# Patient Record
Sex: Female | Born: 1955 | Race: Black or African American | Hispanic: No | Smoking: Never smoker
Health system: Southern US, Community
[De-identification: ages and names within clinical notes are randomized; demographics above are authoritative.]

## PROBLEM LIST (undated history)

## (undated) DIAGNOSIS — I10 Essential (primary) hypertension: Secondary | ICD-10-CM

## (undated) DIAGNOSIS — C50912 Malignant neoplasm of unspecified site of left female breast: Secondary | ICD-10-CM

## (undated) DIAGNOSIS — E119 Type 2 diabetes mellitus without complications: Secondary | ICD-10-CM

## (undated) DIAGNOSIS — E785 Hyperlipidemia, unspecified: Secondary | ICD-10-CM

## (undated) HISTORY — DX: Type 2 diabetes mellitus without complications: E11.9

## (undated) HISTORY — DX: Malignant neoplasm of unspecified site of left female breast: C50.912

## (undated) HISTORY — DX: Essential (primary) hypertension: I10

## (undated) HISTORY — PX: TUBAL LIGATION: SHX77

## (undated) HISTORY — PX: PORT-A-CATH REMOVAL: SHX5289

## (undated) HISTORY — DX: Hyperlipidemia, unspecified: E78.5

---

## 1996-03-15 HISTORY — PX: BREAST BIOPSY: SHX20

## 2013-03-15 DIAGNOSIS — I10 Essential (primary) hypertension: Secondary | ICD-10-CM

## 2013-03-15 HISTORY — PX: BREAST LUMPECTOMY: SHX2

## 2013-03-15 HISTORY — DX: Essential (primary) hypertension: I10

## 2013-10-04 DIAGNOSIS — D701 Agranulocytosis secondary to cancer chemotherapy: Secondary | ICD-10-CM | POA: Insufficient documentation

## 2013-10-04 DIAGNOSIS — T451X5A Adverse effect of antineoplastic and immunosuppressive drugs, initial encounter: Secondary | ICD-10-CM | POA: Insufficient documentation

## 2013-11-09 DIAGNOSIS — E119 Type 2 diabetes mellitus without complications: Secondary | ICD-10-CM | POA: Insufficient documentation

## 2014-03-22 ENCOUNTER — Telehealth: Payer: Self-pay | Admitting: *Deleted

## 2014-03-22 NOTE — Telephone Encounter (Signed)
Ava in the [place] office called stating that Dr. Pablo Ledger is there with this pt and wants pt to have genetics.  Confirmed 04/03/14 genetic appt w/ Ava.  She is to give appt info to pt with my number so the pt can call with any questions or concerns.

## 2014-04-03 ENCOUNTER — Encounter: Payer: Self-pay | Admitting: Genetic Counselor

## 2014-04-03 ENCOUNTER — Other Ambulatory Visit: Payer: Self-pay

## 2014-04-04 DIAGNOSIS — Z95828 Presence of other vascular implants and grafts: Secondary | ICD-10-CM | POA: Insufficient documentation

## 2015-06-17 ENCOUNTER — Encounter (INDEPENDENT_AMBULATORY_CARE_PROVIDER_SITE_OTHER): Payer: Self-pay | Admitting: *Deleted

## 2016-01-05 ENCOUNTER — Encounter (HOSPITAL_COMMUNITY): Payer: BLUE CROSS/BLUE SHIELD

## 2016-01-05 ENCOUNTER — Encounter (HOSPITAL_COMMUNITY): Payer: Self-pay | Admitting: Oncology

## 2016-01-05 ENCOUNTER — Encounter (HOSPITAL_COMMUNITY): Payer: BLUE CROSS/BLUE SHIELD | Attending: Oncology | Admitting: Oncology

## 2016-01-05 VITALS — BP 149/99 | HR 90 | Temp 97.8°F | Resp 16 | Ht 63.0 in | Wt 162.0 lb

## 2016-01-05 DIAGNOSIS — Z23 Encounter for immunization: Secondary | ICD-10-CM

## 2016-01-05 DIAGNOSIS — Z170 Estrogen receptor positive status [ER+]: Principal | ICD-10-CM

## 2016-01-05 DIAGNOSIS — Z780 Asymptomatic menopausal state: Secondary | ICD-10-CM

## 2016-01-05 DIAGNOSIS — C50912 Malignant neoplasm of unspecified site of left female breast: Secondary | ICD-10-CM

## 2016-01-05 DIAGNOSIS — Z79811 Long term (current) use of aromatase inhibitors: Secondary | ICD-10-CM

## 2016-01-05 DIAGNOSIS — C50212 Malignant neoplasm of upper-inner quadrant of left female breast: Secondary | ICD-10-CM | POA: Diagnosis present

## 2016-01-05 HISTORY — DX: Malignant neoplasm of unspecified site of left female breast: C50.912

## 2016-01-05 LAB — CBC WITH DIFFERENTIAL/PLATELET
Basophils Absolute: 0 10*3/uL (ref 0.0–0.1)
Basophils Relative: 0 %
EOS ABS: 0.1 10*3/uL (ref 0.0–0.7)
EOS PCT: 2 %
HCT: 37.9 % (ref 36.0–46.0)
Hemoglobin: 12.9 g/dL (ref 12.0–15.0)
LYMPHS ABS: 2.9 10*3/uL (ref 0.7–4.0)
Lymphocytes Relative: 43 %
MCH: 29.3 pg (ref 26.0–34.0)
MCHC: 34 g/dL (ref 30.0–36.0)
MCV: 86.1 fL (ref 78.0–100.0)
Monocytes Absolute: 0.5 10*3/uL (ref 0.1–1.0)
Monocytes Relative: 7 %
Neutro Abs: 3.3 10*3/uL (ref 1.7–7.7)
Neutrophils Relative %: 48 %
PLATELETS: 185 10*3/uL (ref 150–400)
RBC: 4.4 MIL/uL (ref 3.87–5.11)
RDW: 13.5 % (ref 11.5–15.5)
WBC: 6.7 10*3/uL (ref 4.0–10.5)

## 2016-01-05 LAB — COMPREHENSIVE METABOLIC PANEL
ALT: 17 U/L (ref 14–54)
ANION GAP: 5 (ref 5–15)
AST: 22 U/L (ref 15–41)
Albumin: 3.8 g/dL (ref 3.5–5.0)
Alkaline Phosphatase: 65 U/L (ref 38–126)
BUN: 15 mg/dL (ref 6–20)
CO2: 28 mmol/L (ref 22–32)
Calcium: 9 mg/dL (ref 8.9–10.3)
Chloride: 107 mmol/L (ref 101–111)
Creatinine, Ser: 0.85 mg/dL (ref 0.44–1.00)
GFR calc non Af Amer: >60 mL/min (ref >60)
Glucose, Bld: 79 mg/dL (ref 65–99)
POTASSIUM: 3.4 mmol/L — AB (ref 3.5–5.1)
SODIUM: 140 mmol/L (ref 135–145)
Total Bilirubin: 0.5 mg/dL (ref 0.3–1.2)
Total Protein: 7 g/dL (ref 6.5–8.1)

## 2016-01-05 MED ORDER — HYDROCHLOROTHIAZIDE 12.5 MG PO CAPS: 12.5000 mg | ORAL_CAPSULE | Freq: Every day | ORAL | 0 refills | Status: DC

## 2016-01-05 MED ORDER — TAMOXIFEN CITRATE 20 MG PO TABS: 20.0000 mg | ORAL_TABLET | Freq: Every day | ORAL | 1 refills | Status: DC

## 2016-01-05 MED ORDER — POTASSIUM CHLORIDE CRYS ER 20 MEQ PO TBCR: 20.0000 meq | EXTENDED_RELEASE_TABLET | Freq: Two times a day (BID) | ORAL | 0 refills | Status: DC

## 2016-01-05 MED ORDER — CHOLECALCIFEROL 50 MCG (2000 UT) PO TBDP: 1.0000 | ORAL_TABLET | Freq: Every day | ORAL | 0 refills | Status: DC

## 2016-01-05 MED ORDER — INFLUENZA VAC SPLIT QUAD 0.5 ML IM SUSY
PREFILLED_SYRINGE | INTRAMUSCULAR | Status: AC
  Filled 2016-01-05: qty 0.5

## 2016-01-05 MED ORDER — ZOLPIDEM TARTRATE 10 MG PO TABS: 5.0000 mg | ORAL_TABLET | Freq: Every evening | ORAL | 0 refills | Status: DC | PRN

## 2016-01-05 MED ORDER — ANASTROZOLE 1 MG PO TABS: 1.0000 mg | ORAL_TABLET | Freq: Every day | ORAL | 1 refills | Status: DC

## 2016-01-05 MED ORDER — INFLUENZA VAC SPLIT QUAD 0.5 ML IM SUSY
0.5000 mL | PREFILLED_SYRINGE | Freq: Once | INTRAMUSCULAR | Status: AC
  Administered 2016-01-05: 0.5 mL via INTRAMUSCULAR

## 2016-01-05 MED ORDER — GLIMEPIRIDE 1 MG PO TABS: 0.5000 mg | ORAL_TABLET | Freq: Every day | ORAL | 0 refills | Status: DC

## 2016-01-05 MED ORDER — LOVASTATIN 20 MG PO TABS: 20.0000 mg | ORAL_TABLET | Freq: Every day | ORAL | Status: DC

## 2016-01-05 NOTE — Assessment & Plan Note (Addendum)
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 Anthony Sar followed by adjuvant systemic chemotherapy consisting of AC x 4 followed by T weekly x 12.  She then underwent XRT and is now on daily Tamoxifen.  Oncology history developed.  Staging in [facility] problem list is completed.  Labs today: CBC diff, CMET.  I personally reviewed and went over laboratory results with the patient.  The results are noted within this dictation.  She is 60 years old with uterus intact.  She should be post-menopausal and therefore, after discussion with Dr. Oliva Bustard, we will transition her anti-estrogen therapy to an aromatase inhibitor.  I have reviewed the side effects of AI therapy, including, but not limited to, arthralgias, myalgias, hot flashes, increased risk of osteoporosis, anaphylaxis, death.  Rx is escribed for Arimidex.  Sexual health is discussed.  She denies any dyspareunia.  She is having sex and is in a relationship.  She is divorced.  Despite her denial of dyspareunia and vaginal dryness/atrophy, I have provided her with some elementary education regarding these issues.  I have discussed lubricants.  She is advised that we have options if needed.  Labs in 4-6 weeks: CBC diff, CMET.  I have ordered a bone density exam to ascertain baseline bone density.  She is up to date on mammogram and we will ascertain a copy of her mammogram report from the [facility].  According to [facility], her mammogram was performed on 08/25/2015.    She has never had a screening colonoscopy.  Therefore, we will refer to [place] for a consultation to consider screening colonoscopy.  Influenza vaccine administered today.  Return in 4-6 weeks for follow-up.  She was previously being seen every 6 months at [facility].    Addendum: Mammogram on 08/13/2015 was BIRADS 1.  Order is placed for her future mammogram in May 2018.

## 2016-01-05 NOTE — Progress Notes (Signed)
[facility] Hematology/Oncology Consultation   Name: Laura Moon      MRN: [id number]    Location: Room/bed info not found  Date: 01/06/2016 Time:6:10 PM   REFERRING PHYSICIAN:  Audree Camel, MD (Medical Oncology at [facility])  [place]:  Transfer of medical oncology care   DIAGNOSIS:  Stage IIB (T2N1M0) left invasive ductal breast cancer  HISTORY OF PRESENT ILLNESS:   Laura Moon is a 60 y.o. 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 Anthony Sar followed by adjuvant systemic chemotherapy consisting of AC x 4 followed by T weekly x 12.  She then underwent XRT and is now on daily Tamoxifen beginning in March 2016.    Breast cancer, left ([place])   07/30/2013 Mammogram    Mammogram- 5 cm from nipple, 1.6 x 1.2 x 1.0 cm spiculated, irregular mass in left breast at 11 o'clock position.      08/09/2013 Procedure    US- guided biopsy of left breast mass.      08/09/2013 Pathology Results    Invasive ductal carcinoma      08/20/2013 Procedure    Left lumpectomy and sentinel lymph node biopsy and modified axillary dissection by Dr. Anthony Sar.      08/20/2013 Pathology Results    Invasive tumor is 5 cm in largest dimension, grade 3, negative margins, 1/10 lymph node positive for micrometastasis and 1/10 lymph node positive for macro-metastasis, + LVI.  ER + > 90%, PR + > 90%, Ki-67 23%, HER2+ but ratio was 1.26.      09/27/2013 - 11/08/2013 Chemotherapy    AC x 4 cycles       12/06/2013 - 02/21/2014 Chemotherapy    Weekly Taxol x 12 cycles      04/02/2014 - 05/21/2014 Radiation Therapy    Dr. Pablo Ledger      05/27/2014 - 01/05/2016 Anti-estrogen oral therapy    Tamoxifen 20 mg daily.      01/05/2016 Adverse Reaction    Severe hot flashes.  She is 60 years old as of  01/04/2016.  She should be post-menopausal.      01/05/2016 -  Anti-estrogen oral therapy    Arimidex       We've reviewed side effects of Tamoxifen, including but not limited to, arthralgias, myalgias, hot flashes, increased risk of VTE, and secondary malignancy (GU).  She denies any severe arthralgias, myalgias, signs/symptoms of VTE, and vaginal bleeding/spotting.    She denies a hysterectomy.  She reports that her [facility] was years ago.    She was diagnosed with breast cancer at the age of 63.  There is documentation that she was "perimenopausal" at that time.  As a result, she was started on Tamoxifen post XRT.  She reports "horrendous" hot flashes.  She notes that they severely interfere with her QOL.  She has tried Effexor, but notes nausea and abdominal pain with Effexor.  As a result, she discontinued the medication.  She reports that she is up to date on mammogram.  She has her last mammogram in June 2017 at the [facility].  She is NOT up to date on screening colonoscopy.  She is agreeable to referral to GI today.  She denies any blood in her stool, black stool, change in stool caliber or frequency.  She has chronic edema of her right leg.  It resolves in the AM and improves with LE elevation.  Review of Systems  Constitutional: Negative.  Negative for chills, fever, malaise/fatigue and weight loss.  HENT: Negative.   Eyes: Negative.  Negative for blurred vision and double vision.  Respiratory: Negative.  Negative for cough and sputum production.   Cardiovascular: Positive for leg swelling. Negative for chest pain.  Gastrointestinal: Negative.  Negative for abdominal pain, blood in stool, constipation, diarrhea, melena, nausea and vomiting.  Genitourinary: Negative.  Negative for dysuria.  Musculoskeletal: Negative.  Negative for joint pain and myalgias.  Skin: Negative.  Negative for rash.  Neurological: Negative.  Negative for dizziness, weakness and headaches.    Endo/Heme/Allergies: Negative.   Psychiatric/Behavioral: The patient is nervous/anxious.      PAST MEDICAL HISTORY:   Past Medical History:  Diagnosis Date  . Breast cancer, left ([place]) 01/05/2016    ALLERGIES: Allergies  Allergen Reactions  . Effexor [Venlafaxine] Nausea And Vomiting      MEDICATIONS: I have reviewed the patient's current medications.    No current outpatient prescriptions on file prior to visit.   No current facility-administered medications on file prior to visit.      PAST SURGICAL HISTORY No past surgical history on file.  FAMILY HISTORY: No family history on file.  Mother with ER+ breast cancer , metastatic at the age of 106, managed with AI therapy.  Deceased.  She has 2 daughters ages 87 and 77.  Both are healthy.  She has 3 grandsons who are healthy too.  SOCIAL HISTORY: She denies any tobacco abuse.  She reports rare EtOH use.  She denies any heavy EtOH use.  She denies any illicit drug abuse.  She is "Holiness" in religion.  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  She is divorced.  She is currently in a relationship.  Social History   Social History  . Marital status: Married    Spouse name: N/A  . Number of children: N/A  . Years of education: N/A   Social History Main Topics  . Smoking status: Not on file  . Smokeless tobacco: Not on file  . Alcohol use Not on file  . Drug use: Unknown  . Sexual activity: Not on file   Other Topics Concern  . Not on file   Social History Narrative  . No narrative on file    PERFORMANCE STATUS: The patient's performance status is 1 - Symptomatic but completely ambulatory  PHYSICAL EXAM: Most Recent Vital Signs: Blood pressure (!) 149/99, pulse 90, temperature 97.8 F (36.6 C), temperature source Oral, resp. rate 16, height 5' 3" (1.6 m), weight 162 lb (73.5 kg), SpO2 100 %. General appearance: alert, cooperative, appears stated age, no distress and accompanied by  daughter Head: Normocephalic, without obvious abnormality, atraumatic Eyes: negative findings: lids and lashes normal and conjunctivae and sclerae normal Throat: lips, mucosa, and tongue normal; teeth and gums normal Neck: no adenopathy and supple, symmetrical, trachea midline Lungs: clear to auscultation bilaterally and normal percussion bilaterally Breasts: negative findings: right breast without any masses, lesions, skin changes, nipple changes, dimpling of skin, positive findings: right upper inner quadrant of breast with port-a-cath scar, left breast is S/P lumpectomy with scar in the 11 o'clock position with some thickened skin at the surgical site and inferiorly.  No new palpable abnormalities, skin changes, nipple changes, or palpable masses/lesion Heart: regular rate and rhythm, S1, S2 normal, no  murmur, click, rub or gallop Abdomen: soft, non-tender; bowel sounds normal; no masses,  no organomegaly Extremities: edema right leg with 1+ pitting edema without erythema, heat or tenderness on palpation. Skin: Skin color, texture, turgor normal. No rashes or lesions Lymph nodes: Cervical, supraclavicular, and axillary nodes normal. Neurologic: Grossly normal  LABORATORY DATA:  CBC    Component Value Date/Time   WBC 6.7 01/05/2016 1257   RBC 4.40 01/05/2016 1257   HGB 12.9 01/05/2016 1257   HCT 37.9 01/05/2016 1257   PLT 185 01/05/2016 1257   MCV 86.1 01/05/2016 1257   MCH 29.3 01/05/2016 1257   MCHC 34.0 01/05/2016 1257   RDW 13.5 01/05/2016 1257   LYMPHSABS 2.9 01/05/2016 1257   MONOABS 0.5 01/05/2016 1257   EOSABS 0.1 01/05/2016 1257   BASOSABS 0.0 01/05/2016 1257     Chemistry      Component Value Date/Time   NA 140 01/05/2016 1257   K 3.4 (L) 01/05/2016 1257   CL 107 01/05/2016 1257   CO2 28 01/05/2016 1257   BUN 15 01/05/2016 1257   CREATININE 0.85 01/05/2016 1257      Component Value Date/Time   CALCIUM 9.0 01/05/2016 1257   ALKPHOS 65 01/05/2016 1257   AST 22  01/05/2016 1257   ALT 17 01/05/2016 1257   BILITOT 0.5 01/05/2016 1257      RADIOGRAPHY: No results found.     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 Anthony Sar followed by adjuvant systemic chemotherapy consisting of AC x 4 followed by T weekly x 12.  She then underwent XRT and is now on daily Tamoxifen.  Oncology history developed.  Staging in [facility] problem list is completed.  Labs today: CBC diff, CMET.  I personally reviewed and went over laboratory results with the patient.  The results are noted within this dictation.  She is 60 years old with uterus intact.  She should be post-menopausal and therefore, after discussion with Dr. Oliva Bustard, we will transition her anti-estrogen therapy to an aromatase inhibitor.  I have reviewed the side effects of AI therapy, including, but not limited to, arthralgias, myalgias, hot flashes, increased risk of osteoporosis, anaphylaxis, death.  Rx is escribed for Arimidex.  Sexual health is discussed.  She denies any dyspareunia.  She is having sex and is in a relationship.  She is divorced.  Despite her denial of dyspareunia and vaginal dryness/atrophy, I have provided her with some elementary education regarding these issues.  I have discussed lubricants.  She is advised that we have options if needed.  Labs in 4-6 weeks: CBC diff, CMET.  I have ordered a bone density exam to ascertain baseline bone density.  She is up to date on mammogram and we will ascertain a copy of her mammogram report from the [facility].  According to [facility], her mammogram was performed on 08/25/2015.    She has never had a screening colonoscopy.  Therefore, we will refer to [place] for a consultation to consider screening colonoscopy.  Influenza vaccine administered today.  Return in 4-6 weeks for  follow-up.  She was previously being seen every 6 months at [facility].    Addendum: Mammogram on 08/13/2015 was BIRADS 1.  Order is placed for her future mammogram in May 2018.  ORDERS PLACED FOR THIS ENCOUNTER: Orders Placed This Encounter  Procedures  .  DG Bone Density  . MM DIAG BREAST TOMO BILATERAL  . CBC with Differential  . Comprehensive metabolic panel  . CBC with Differential  . Comprehensive metabolic panel    MEDICATIONS PRESCRIBED THIS ENCOUNTER: Meds ordered this encounter  Medications  . glimepiride (AMARYL) 1 MG tablet    Sig: Take 0.5 tablets (0.5 mg total) by mouth daily with breakfast.    Dispense:  45 tablet    Refill:  0    Order Specific Question:   Supervising Provider    Answer:   Patrici Ranks [id number]  . lovastatin (MEVACOR) 20 MG tablet    Sig: Take 1 tablet (20 mg total) by mouth at bedtime.    Dispense:  90 tablet    Order Specific Question:   Supervising Provider    Answer:   Patrici Ranks [id number]  . DISCONTD: tamoxifen (NOLVADEX) 20 MG tablet    Sig: Take 1 tablet (20 mg total) by mouth daily.    Dispense:  90 tablet    Refill:  1    Order Specific Question:   Supervising Provider    Answer:   Patrici Ranks [id number]  . hydrochlorothiazide (MICROZIDE) 12.5 MG capsule    Sig: Take 1 capsule (12.5 mg total) by mouth daily.    Dispense:  90 capsule    Refill:  0    Order Specific Question:   Supervising Provider    Answer:   Patrici Ranks [id number]  . zolpidem (AMBIEN) 10 MG tablet    Sig: Take 0.5-1 tablets (5-10 mg total) by mouth at bedtime as needed for sleep.    Dispense:  30 tablet    Refill:  0    Order Specific Question:   Supervising Provider    Answer:   Patrici Ranks [id number]  . Cholecalciferol (D3 DOTS) 2000 units TBDP    Sig: Take 1 capsule by mouth daily.    Dispense:  30 tablet    Refill:  0    Order Specific Question:   Supervising Provider    Answer:   Patrici Ranks [id number]  . potassium  chloride SA (K-DUR,KLOR-CON) 20 MEQ tablet    Sig: Take 1 tablet (20 mEq total) by mouth 2 (two) times daily.    Dispense:  60 tablet    Refill:  0    Order Specific Question:   Supervising Provider    Answer:   Patrici Ranks [id number]  . venlafaxine (EFFEXOR) 37.5 MG tablet    Sig: Take 37.5 mg by mouth 2 (two) times daily with a meal.  . Influenza vac split quadrivalent PF (FLUARIX) injection 0.5 mL  . anastrozole (ARIMIDEX) 1 MG tablet    Sig: Take 1 tablet (1 mg total) by mouth daily.    Dispense:  30 tablet    Refill:  1    Order Specific Question:   Supervising Provider    Answer:   Patrici Ranks [id number]    All questions were answered. The patient knows to call the clinic with any problems, questions or concerns. We can certainly see the patient much sooner if necessary.  Patient discussed with Dr. Oliva Bustard and together we ascertained an up-to-date interval history, and examined the patient.  Dr. Oliva Bustard developed the patient's assessment and plan.  This was a shared visit-consultation.  Her attestation will follow below.  This note is electronically signed by: Doy Mince 01/06/2016 6:10 PM  Attending's  note   Reviewed physical findings as well  as history and discussed  plan with physician assistant. I agreed with the plan.

## 2016-01-05 NOTE — Progress Notes (Signed)
Pt given flu shot in right deltoid. Pt tolerated well. Pt stable and discharged home ambulatory with daughter.

## 2016-01-05 NOTE — Patient Instructions (Addendum)
[place] at [facility] Discharge Instructions  RECOMMENDATIONS MADE BY THE CONSULTANT AND ANY TEST RESULTS WILL BE SENT TO YOUR REFERRING PHYSICIAN.  You were seen today by Kirby Crigler PA-C. Flu shot given today. Labs drawn today, will call with results. Bone Density test in 2-3 weeks. Referred to [facility] for screening colonoscopy. Follow up 4-6 weeks and also labs.  Thank you for choosing [place] at [facility] to provide your oncology and hematology care.  To afford each patient quality time with our provider, please arrive at least 15 minutes before your scheduled appointment time.   Beginning January 23rd 2017 lab work for the [facility] will be done in the  Main lab at [facility] on 1st floor. If you have a lab appointment with the [place] please come in thru the  Main Entrance and check in at the main information desk  You need to re-schedule your appointment should you arrive 10 or more minutes late.  We strive to give you quality time with our providers, and arriving late affects you and other patients whose appointments are after yours.  Also, if you no show three or more times for appointments you may be dismissed from the clinic at the providers discretion.     Again, thank you for choosing [facility].  Our hope is that these requests will decrease the amount of time that you wait before being seen by our physicians.       _____________________________________________________________  Should you have questions after your visit to [facility], please contact our office at [phone] between the hours of 8:30 a.m. and 4:30 p.m.  Voicemails left after 4:30 p.m. will not be returned until the following business day.  For prescription refill requests, have your pharmacy contact our office.         Resources For Cancer Patients and their Caregivers ? American Cancer Society: Can  assist with transportation, wigs, general needs, runs Look Good Feel Better.        289 472 1615 ? Cancer Care: Provides financial assistance, online support groups, medication/co-pay assistance.  1-800-813-HOPE 334-667-1133) ? [place] Assists [place] cancer patients and their families through emotional , educational and financial support.  4316332553 ? [place] DSS Where to apply for food stamps, Medicaid and utility assistance. 5813423742 ? [employer]: Transportation to medical appointments. (361)751-6636 ? Social Security Administration: May apply for disability if have a Stage IV cancer. 740-620-4187 423 507 3884 ? [employer], Disability and Transit Services: Assists with nutrition, care and transit needs. [place] Support Programs: @10RELATIVEDAYS @ > Cancer Support Group  2nd Tuesday of the month 1pm-2pm, Journey Room  > Creative Journey  3rd Tuesday of the month 1130am-1pm, Journey Room  > Look Good Feel Better  1st Wednesday of the month 10am-12 noon, Journey Room (Call [place] to register [phone])

## 2016-01-20 ENCOUNTER — Other Ambulatory Visit (HOSPITAL_COMMUNITY): Payer: Self-pay

## 2016-01-20 DIAGNOSIS — Z170 Estrogen receptor positive status [ER+]: Principal | ICD-10-CM

## 2016-01-20 DIAGNOSIS — C50212 Malignant neoplasm of upper-inner quadrant of left female breast: Secondary | ICD-10-CM

## 2016-01-20 MED ORDER — ANASTROZOLE 1 MG PO TABS: 1.0000 mg | ORAL_TABLET | Freq: Every day | ORAL | 1 refills | Status: DC

## 2016-01-20 MED ORDER — POTASSIUM CHLORIDE CRYS ER 20 MEQ PO TBCR: 20.0000 meq | EXTENDED_RELEASE_TABLET | Freq: Two times a day (BID) | ORAL | 1 refills | Status: DC

## 2016-01-20 NOTE — Telephone Encounter (Signed)
Received refill request from patients pharmacy for 90 day supply of anastrazole and klor-con. Chart checked and refilled.

## 2016-01-22 ENCOUNTER — Other Ambulatory Visit (HOSPITAL_COMMUNITY): Payer: Self-pay

## 2016-01-28 ENCOUNTER — Other Ambulatory Visit (HOSPITAL_COMMUNITY): Payer: Self-pay

## 2016-02-02 ENCOUNTER — Ambulatory Visit (HOSPITAL_COMMUNITY)
Admission: RE | Admit: 2016-02-02 | Discharge: 2016-02-02 | Disposition: A | Discharge status: Home / Self Care | Payer: BLUE CROSS/BLUE SHIELD | Source: Ambulatory Visit | Attending: Oncology | Admitting: Oncology

## 2016-02-02 ENCOUNTER — Encounter (HOSPITAL_COMMUNITY): Payer: Self-pay | Admitting: Radiology

## 2016-02-02 DIAGNOSIS — Z170 Estrogen receptor positive status [ER+]: Secondary | ICD-10-CM | POA: Insufficient documentation

## 2016-02-02 DIAGNOSIS — C50212 Malignant neoplasm of upper-inner quadrant of left female breast: Secondary | ICD-10-CM | POA: Insufficient documentation

## 2016-02-02 DIAGNOSIS — Z780 Asymptomatic menopausal state: Secondary | ICD-10-CM | POA: Diagnosis present

## 2016-02-16 ENCOUNTER — Other Ambulatory Visit (HOSPITAL_COMMUNITY): Payer: Self-pay

## 2016-02-16 ENCOUNTER — Ambulatory Visit (HOSPITAL_COMMUNITY): Payer: Self-pay | Admitting: Hematology & Oncology

## 2016-03-19 ENCOUNTER — Encounter (HOSPITAL_COMMUNITY): Payer: BLUE CROSS/BLUE SHIELD | Attending: Oncology

## 2016-03-19 ENCOUNTER — Encounter (HOSPITAL_COMMUNITY): Payer: BLUE CROSS/BLUE SHIELD | Attending: Hematology & Oncology | Admitting: Hematology & Oncology

## 2016-03-19 ENCOUNTER — Encounter (HOSPITAL_COMMUNITY): Payer: Self-pay | Admitting: Hematology & Oncology

## 2016-03-19 VITALS — BP 163/98 | HR 93 | Temp 97.8°F | Resp 18 | Wt 166.9 lb

## 2016-03-19 DIAGNOSIS — Z79811 Long term (current) use of aromatase inhibitors: Secondary | ICD-10-CM | POA: Diagnosis not present

## 2016-03-19 DIAGNOSIS — C50212 Malignant neoplasm of upper-inner quadrant of left female breast: Secondary | ICD-10-CM

## 2016-03-19 DIAGNOSIS — Z780 Asymptomatic menopausal state: Secondary | ICD-10-CM

## 2016-03-19 DIAGNOSIS — Z170 Estrogen receptor positive status [ER+]: Secondary | ICD-10-CM | POA: Insufficient documentation

## 2016-03-19 DIAGNOSIS — I10 Essential (primary) hypertension: Secondary | ICD-10-CM | POA: Insufficient documentation

## 2016-03-19 LAB — CBC WITH DIFFERENTIAL/PLATELET
BASOS ABS: 0 10*3/uL (ref 0.0–0.1)
BASOS PCT: 1 %
Eosinophils Absolute: 0.2 10*3/uL (ref 0.0–0.7)
Eosinophils Relative: 3 %
HEMATOCRIT: 38.1 % (ref 36.0–46.0)
HEMOGLOBIN: 12.9 g/dL (ref 12.0–15.0)
LYMPHS PCT: 46 %
Lymphs Abs: 2.9 10*3/uL (ref 0.7–4.0)
MCH: 29.7 pg (ref 26.0–34.0)
MCHC: 33.9 g/dL (ref 30.0–36.0)
MCV: 87.6 fL (ref 78.0–100.0)
MONO ABS: 0.4 10*3/uL (ref 0.1–1.0)
MONOS PCT: 7 %
NEUTROS ABS: 2.9 10*3/uL (ref 1.7–7.7)
NEUTROS PCT: 45 %
Platelets: 190 10*3/uL (ref 150–400)
RBC: 4.35 MIL/uL (ref 3.87–5.11)
RDW: 13.8 % (ref 11.5–15.5)
WBC: 6.4 10*3/uL (ref 4.0–10.5)

## 2016-03-19 LAB — COMPREHENSIVE METABOLIC PANEL
ALBUMIN: 3.6 g/dL (ref 3.5–5.0)
ALK PHOS: 67 U/L (ref 38–126)
ALT: 17 U/L (ref 14–54)
AST: 21 U/L (ref 15–41)
Anion gap: 6 (ref 5–15)
BILIRUBIN TOTAL: 0.4 mg/dL (ref 0.3–1.2)
BUN: 14 mg/dL (ref 6–20)
CALCIUM: 9 mg/dL (ref 8.9–10.3)
CO2: 28 mmol/L (ref 22–32)
Chloride: 105 mmol/L (ref 101–111)
Creatinine, Ser: 0.84 mg/dL (ref 0.44–1.00)
GFR calc Af Amer: >60 mL/min (ref >60)
GLUCOSE: 133 mg/dL — AB (ref 65–99)
POTASSIUM: 3.4 mmol/L — AB (ref 3.5–5.1)
Sodium: 139 mmol/L (ref 135–145)
TOTAL PROTEIN: 6.8 g/dL (ref 6.5–8.1)

## 2016-03-19 MED ORDER — NYSTATIN 100000 UNIT/GM EX POWD: CUTANEOUS | 2 refills | Status: DC

## 2016-03-19 NOTE — Patient Instructions (Addendum)
[place] at [facility] Discharge Instructions  RECOMMENDATIONS MADE BY THE CONSULTANT AND ANY TEST RESULTS WILL BE SENT TO YOUR REFERRING PHYSICIAN.  You were seen today by Dr. Whitney Muse Nystatin powder called in to [place], apply under breasts twice daily Follow up in clinic in 3 months Referral to GI for screening colonoscopy  Thank you for choosing [place] at [facility] to provide your oncology and hematology care.  To afford each patient quality time with our provider, please arrive at least 15 minutes before your scheduled appointment time.    If you have a lab appointment with the [place] please come in thru the  Main Entrance and check in at the main information desk  You need to re-schedule your appointment should you arrive 10 or more minutes late.  We strive to give you quality time with our providers, and arriving late affects you and other patients whose appointments are after yours.  Also, if you no show three or more times for appointments you may be dismissed from the clinic at the providers discretion.     Again, thank you for choosing [facility].  Our hope is that these requests will decrease the amount of time that you wait before being seen by our physicians.       _____________________________________________________________  Should you have questions after your visit to [facility], please contact our office at [phone] between the hours of 8:30 a.m. and 4:30 p.m.  Voicemails left after 4:30 p.m. will not be returned until the following business day.  For prescription refill requests, have your pharmacy contact our office.       Resources For Cancer Patients and their Caregivers ? American Cancer Society: Can assist with transportation, wigs, general needs, runs Look Good Feel Better.        647-647-9949 ? Cancer Care: Provides financial assistance, online support groups,  medication/co-pay assistance.  1-800-813-HOPE 567-876-0592) ? [place] Assists [place] cancer patients and their families through emotional , educational and financial support.  780 319 3479 ? [place] DSS Where to apply for food stamps, Medicaid and utility assistance. 516-180-8615 ? [employer]: Transportation to medical appointments. (419) 784-8225 ? Social Security Administration: May apply for disability if have a Stage IV cancer. 207-865-4738 3657187195 ? [employer], Disability and Transit Services: Assists with nutrition, care and transit needs. [place] Support Programs: @10RELATIVEDAYS @ > Cancer Support Group  2nd Tuesday of the month 1pm-2pm, Journey Room  > Creative Journey  3rd Tuesday of the month 1130am-1pm, Journey Room  > Look Good Feel Better  1st Wednesday of the month 10am-12 noon, Journey Room (Call [place] to register [phone])

## 2016-03-19 NOTE — Progress Notes (Signed)
PROGRESS NOTE  Laura Savage, MD [address] [place] [address]   DIAGNOSIS: Malignant neoplasm of left female breast [facility])   Staging form: Breast, AJCC 7th Edition   - Clinical stage from 07/30/2013: Stage IIB (T2, N1, M0) - Signed by Baird Cancer, PA-C on 01/05/2016  SUMMARY OF ONCOLOGIC HISTORY:   Malignant neoplasm of left female breast ([place])   07/30/2013 Mammogram    Mammogram- 5 cm from nipple, 1.6 x 1.2 x 1.0 cm spiculated, irregular mass in left breast at 11 o'clock position.      08/09/2013 Procedure    US- guided biopsy of left breast mass.      08/09/2013 Pathology Results    Invasive ductal carcinoma      08/20/2013 Procedure    Left lumpectomy and sentinel lymph node biopsy and modified axillary dissection by Dr. Anthony Sar.      08/20/2013 Pathology Results    Invasive tumor is 5 cm in largest dimension, grade 3, negative margins, 1/10 lymph node positive for micrometastasis and 1/10 lymph node positive for macro-metastasis, + LVI.  ER + > 90%, PR + > 90%, Ki-67 23%, HER2+ but ratio was 1.26.      09/27/2013 - 11/08/2013 Chemotherapy    AC x 4 cycles       12/06/2013 - 02/21/2014 Chemotherapy    Weekly Taxol x 12 cycles      04/02/2014 - 05/21/2014 Radiation Therapy    Dr. Pablo Ledger      05/27/2014 - 01/05/2016 Anti-estrogen oral therapy    Tamoxifen 20 mg daily.      01/05/2016 Adverse Reaction    Severe hot flashes.  She is 61 years old as of 01/04/2016.  She should be post-menopausal.      01/05/2016 -  Anti-estrogen oral therapy    Arimidex      02/02/2016 Imaging    Bone density- BMD as determined from Femur Neck Left is 0.927 g/cm2 with a T-Score of -0.8. This patient is considered normal according to [facility]) criteria.       CURRENT THERAPY:  INTERVAL HISTORY: Laura Moon 61 y.o. female returns for a malignant neoplasm of upper-inner quadrant of left breast, estrogen receptor positive.   Mrs. Boardley  presents to the cancer center today unaccompanied.  She is doing well on her medications. She use to get hot flashes, but they have improved since she started arimidex and stopped the tamoxifen.   She gets numbness in her fingers since the chemotherapy. It only happens when she sleeps. She is very active and doesn't notice it during the day.   She is concerned she will get the flu from co workers. She has had her flu shot.   Her skin has been dry. She has been taking vitamin E but it hasn't been completely helping it. She isn't sure if it's just the weather causing it.   She has been craving crackers. She would like to know if it is okay/healthy to be having these cravings.   Her last menstral cycle was when she started her chemotherapy back in 2015, she has not had a cycle since.   Denies abdominal pain or blood in her stool. She has some swelling in her right ankle. This has been happening since she was a teenager. She is due for another mammogram in May, this has been ordered.    MEDICAL HISTORY: Past Medical History:  Diagnosis Date  . Breast cancer, left ([place]) 01/05/2016  .  Hypertension 2015    SURGICAL HISTORY: Past Surgical History:  Procedure Laterality Date  . BREAST BIOPSY Right 1998    SOCIAL HISTORY: Social History   Social History  . Marital status: Married    Spouse name: N/A  . Number of children: N/A  . Years of education: N/A   Occupational History  . Not on file.   Social History Main Topics  . Smoking status: Never Smoker  . Smokeless tobacco: Never Used  . Alcohol use No  . Drug use: No  . Sexual activity: Yes   Other Topics Concern  . Not on file   Social History Narrative  . No narrative on file    FAMILY HISTORY: Family History  Problem Relation Age of Onset  . Cancer Mother   . COPD Father   . Emphysema Father   . Hypercholesterolemia Sister   . Hypertension Sister   . Cancer Brother   . Pulmonary embolism Maternal Aunt   .  Diabetes Paternal Aunt   . Stroke Maternal Grandmother   . Stroke Maternal Grandfather   . Aneurysm Paternal Grandmother   . Stroke Paternal Grandfather   . Hypertension Daughter   . Edema Daughter     Review of Systems  Constitutional: Negative.   HENT: Negative.   Eyes: Negative.   Respiratory: Negative.   Cardiovascular: Positive for leg swelling.  Gastrointestinal: Negative.  Negative for abdominal pain and blood in stool.  Genitourinary: Negative.   Musculoskeletal: Negative.   Skin: Negative.        Pos dry skin  Neurological:       Pos numbness in fingers  Endo/Heme/Allergies: Negative.   Psychiatric/Behavioral: Negative.  The patient does not have insomnia.   All other systems reviewed and are negative. 14 point review of systems was performed and is negative except as detailed under history of present illness and above   PHYSICAL EXAMINATION  ECOG PERFORMANCE STATUS: 0 - Asymptomatic  Vitals:   03/19/16 1352  BP: (!) 163/98  Pulse: 93  Resp: 18  Temp: 97.8 F (36.6 C)     Physical Exam  Constitutional: She is oriented to person, place, and time and well-developed, well-nourished, and in no distress.  Pt was able to get on exam table without assistance.   HENT:  Head: Normocephalic and atraumatic.  Eyes: EOM are normal. Pupils are equal, round, and reactive to light. No scleral icterus.  Neck: Normal range of motion. Neck supple. No thyromegaly present.  Cardiovascular: Normal rate, regular rhythm and normal heart sounds.   Pulmonary/Chest: Effort normal and breath sounds normal. No respiratory distress.  Abdominal: Soft. Bowel sounds are normal. She exhibits no distension and no mass. There is no tenderness. There is no rebound and no guarding.  Musculoskeletal: Normal range of motion.  Lymphadenopathy:    She has no cervical adenopathy.  Neurological: She is alert and oriented to person, place, and time. Gait normal.  Skin: Skin is warm and dry.    Psychiatric: Mood, memory, affect and judgment normal.  Nursing note and vitals reviewed.  LABORATORY DATA:  CBC    Component Value Date/Time   WBC 6.4 03/19/2016 1334   RBC 4.35 03/19/2016 1334   HGB 12.9 03/19/2016 1334   HCT 38.1 03/19/2016 1334   PLT 190 03/19/2016 1334   MCV 87.6 03/19/2016 1334   MCH 29.7 03/19/2016 1334   MCHC 33.9 03/19/2016 1334   RDW 13.8 03/19/2016 1334   LYMPHSABS 2.9 03/19/2016 1334   MONOABS  0.4 03/19/2016 1334   EOSABS 0.2 03/19/2016 1334   BASOSABS 0.0 03/19/2016 1334    CMP     Component Value Date/Time   NA 139 03/19/2016 1334   K 3.4 (L) 03/19/2016 1334   CL 105 03/19/2016 1334   CO2 28 03/19/2016 1334   GLUCOSE 133 (H) 03/19/2016 1334   BUN 14 03/19/2016 1334   CREATININE 0.84 03/19/2016 1334   CALCIUM 9.0 03/19/2016 1334   PROT 6.8 03/19/2016 1334   ALBUMIN 3.6 03/19/2016 1334   AST 21 03/19/2016 1334   ALT 17 03/19/2016 1334   ALKPHOS 67 03/19/2016 1334   BILITOT 0.4 03/19/2016 1334   GFRNONAA >60 03/19/2016 1334   GFRAA >60 03/19/2016 1334      PENDING LABS:   RADIOGRAPHIC STUDIES:  Study Result   EXAM: DUAL X-RAY ABSORPTIOMETRY (DXA) FOR BONE MINERAL DENSITY  IMPRESSION: Ordering Physician:  Dr. Baird Cancer,  Your patient Laura Moon completed a BMD test on 02/02/2016 using the [place] (software version: 14.10) manufactured by [employer]. The following summarizes the results of our evaluation. PATIENT BIOGRAPHICAL: Name: Laura Moon, Laura Moon Patient ID: [id number] Birth Date: 02-23-1956 Height: 62.5 in. Gender: Female Exam Date: 02/02/2016 Weight: 160.0 lbs. Indications: Height Loss, Hx Breast Ca, Post Menopausal Fractures: Treatments: Anastrozole, Vitamin D DENSITOMETRY RESULTS: Site      Region    Measured Date Measured Age WHO Classification Young Adult T-score BMD         %Change vs. Previous Significant Change (*) AP Spine L1-L4 02/02/2016 60.0 Normal 0.1 1.195  g/cm2 - -  DualFemur Neck Left 02/02/2016 60.0 Normal -0.8 0.927 g/cm2 - - ASSESSMENT: BMD as determined from Femur Neck Left is 0.927 g/cm2 with a T-Score of -0.8. This patient is considered normal according to [facility]) criteria.  World Pharmacologist (WHO) criteria for post-menopausal, Caucasian Women: Normal:       T-score at or above -1 SD Osteopenia:   T-score between -1 and -2.5 SD Osteoporosis: T-score at or below -2.5 SD  RECOMMENDATIONS: [place] recommends that FDA-approved medial therapies be considered in postmenopausal women and men age 37 or older with a: 1. Hip or vertebral (clinical or morphometric) fracture. 2. T-Score of < -2.5 at the spine or hip. 3. Ten-year fracture probability by FRAX of 3% or greater for hip fracture or 20% or greater for major osteoporotic fracture.  All treatment decisions require clinical judgment and consideration of individual patient factors, including patient preferences, co-morbidities, previous drug use, risk factors not captured in the FRAX model (e.g. falls, vitamin D deficiency, increased bone turnover, interval significant decline in bone density) and possible under-or over-estimation of fracture risk by FRAX.  All patients should ensure an adequate intake of dietary calcium (1200 mg/d) and vitamin D (800 IU daily) unless contraindicated.  FOLLOW-UP: People with diagnosed cases of osteoporosis or osteopenia should be regularly tested for bone mineral density. For patients eligible for Medicare, routine testing is allowed once every 2 years. Testing frequency can be increased for patients who have rapidly progressing disease, or for those who are receiving medical therapy to restore bone mass.  I have reviewed this report, and agree with the above findings.  [facility]   Electronically Signed   By: Rolm Baptise M.D.   On: 02/02/2016 11:03       ASSESSMENT and THERAPY PLAN:  Malignant neoplasm of upper-inner quadrant of left breast, estrogen receptor positive Long term  use of AI Normal Bone Density  Labs reviewed. Results are noted above.  We discussed taking Calcium and vitamin D every day. Bone density was reviewed and WNL. Physical activity was encouraged.   I will write her a referral to GI for screening colonoscopy.   I will write her a prescription for Nystatin powder to use under the inframmammary folds of her breasts.   She will return for a follow up in 3 months.    Meds ordered this encounter  Medications  . DISCONTD: nystatin (MYCOSTATIN/NYSTOP) powder    Sig: Apply powder under breasts twice daily    Dispense:  15 g    Refill:  2   All questions were answered. The patient knows to call the clinic with any problems, questions or concerns. We can certainly see the patient much sooner if necessary.  This document serves as a record of services personally performed by Ancil Linsey, MD. It was created on her behalf by Martinique Casey, a trained medical scribe. The creation of this record is based on the scribe's personal observations and the provider's statements to them. This document has been checked and approved by the attending provider.  I have reviewed the above documentation for accuracy and completeness and I agree with the above.  This note was electronically signed. Molli Hazard, MD  03/19/2016

## 2016-03-22 ENCOUNTER — Telehealth (HOSPITAL_COMMUNITY): Payer: Self-pay | Admitting: *Deleted

## 2016-03-22 MED ORDER — NYSTATIN 100000 UNIT/GM EX POWD: CUTANEOUS | 2 refills | Status: DC

## 2016-03-22 NOTE — Telephone Encounter (Signed)
Pt aware that medication had been sent to [facility] in [place]. Pt verbalized understanding.

## 2016-04-14 ENCOUNTER — Encounter (HOSPITAL_COMMUNITY): Payer: Self-pay | Admitting: Hematology & Oncology

## 2016-06-28 ENCOUNTER — Encounter (HOSPITAL_COMMUNITY): Payer: Self-pay

## 2016-06-28 ENCOUNTER — Encounter (HOSPITAL_COMMUNITY): Payer: BLUE CROSS/BLUE SHIELD | Attending: Oncology | Admitting: Oncology

## 2016-06-28 VITALS — BP 159/103 | HR 87 | Temp 97.7°F | Resp 16 | Wt 170.1 lb

## 2016-06-28 DIAGNOSIS — Z170 Estrogen receptor positive status [ER+]: Secondary | ICD-10-CM

## 2016-06-28 DIAGNOSIS — C50212 Malignant neoplasm of upper-inner quadrant of left female breast: Secondary | ICD-10-CM | POA: Insufficient documentation

## 2016-06-28 DIAGNOSIS — N951 Menopausal and female climacteric states: Secondary | ICD-10-CM

## 2016-06-28 DIAGNOSIS — R61 Generalized hyperhidrosis: Secondary | ICD-10-CM | POA: Diagnosis not present

## 2016-06-28 DIAGNOSIS — C50112 Malignant neoplasm of central portion of left female breast: Secondary | ICD-10-CM

## 2016-06-28 MED ORDER — CALCIUM CARBONATE ANTACID 500 MG PO CHEW: 2.0000 | CHEWABLE_TABLET | Freq: Every day | ORAL | 6 refills | Status: DC

## 2016-06-28 MED ORDER — TAMOXIFEN CITRATE 20 MG PO TABS: 20.0000 mg | ORAL_TABLET | Freq: Every day | ORAL | 4 refills | Status: DC

## 2016-06-28 MED ORDER — NYSTATIN 100000 UNIT/GM EX POWD: CUTANEOUS | 2 refills | Status: DC

## 2016-06-28 MED ORDER — CITALOPRAM HYDROBROMIDE 10 MG PO TABS: 10.0000 mg | ORAL_TABLET | Freq: Every day | ORAL | 6 refills | Status: DC

## 2016-06-28 MED ORDER — ANASTROZOLE 1 MG PO TABS: 1.0000 mg | ORAL_TABLET | Freq: Every day | ORAL | 1 refills | Status: DC

## 2016-06-28 NOTE — Patient Instructions (Signed)
[facility] at [facility] Discharge Instructions  RECOMMENDATIONS MADE BY THE CONSULTANT AND ANY TEST RESULTS WILL BE SENT TO YOUR REFERRING PHYSICIAN.  You were seen today by Dr. Louise Zhou Follow up in 6 months with lab work See Amy up front for appointments   Thank you for choosing [facility] at [facility] to provide your oncology and hematology care.  To afford each patient quality time with our provider, please arrive at least 15 minutes before your scheduled appointment time.    If you have a lab appointment with the Cancer Center please come in thru the  Main Entrance and check in at the main information desk  You need to re-schedule your appointment should you arrive 10 or more minutes late.  We strive to give you quality time with our providers, and arriving late affects you and other patients whose appointments are after yours.  Also, if you no show three or more times for appointments you may be dismissed from the clinic at the providers discretion.     Again, thank you for choosing [facility].  Our hope is that these requests will decrease the amount of time that you wait before being seen by our physicians.       _____________________________________________________________  Should you have questions after your visit to [facility], please contact our office at [phone] between the hours of 8:30 a.m. and 4:30 p.m.  Voicemails left after 4:30 p.m. will not be returned until the following business day.  For prescription refill requests, have your pharmacy contact our office.       Resources For Cancer Patients and their Caregivers ? American Cancer Society: Can assist with transportation, wigs, general needs, runs Look Good Feel Better.        1-888-227-6333 ? Cancer Care: Provides financial assistance, online support groups, medication/co-pay assistance.  1-800-813-HOPE (4673) ? [facility] Assists [place] cancer patients and their families through emotional , educational and financial support.  336-427-4357 ? [place] DSS Where to apply for food stamps, Medicaid and utility assistance. 336-342-1394 ? [facility]: Transportation to medical appointments. 336-347-2287 ? Social Security Administration: May apply for disability if have a Stage IV cancer. 336-342-7796 1-800-772-1213 ? [place] Aging, Disability and Transit Services: Assists with nutrition, care and transit needs. 336-349-2343  Cancer Center Support Programs: @10RELATIVEDAYS@ > Cancer Support Group  2nd Tuesday of the month 1pm-2pm, Journey Room  > Creative Journey  3rd Tuesday of the month 1130am-1pm, Journey Room  > Look Good Feel Better  1st Wednesday of the month 10am-12 noon, Journey Room (Call American Cancer Society to register 1-800-395-5775)    

## 2016-06-28 NOTE — Progress Notes (Signed)
PROGRESS NOTE  Laura Savage, MD [address] [place] [id number]   DIAGNOSIS: Cancer Staging Malignant neoplasm of left female breast [facility]) Staging form: Breast, AJCC 7th Edition - Clinical stage from 07/30/2013: Stage IIB (T2, N1, M0) - Signed by Baird Cancer, PA-C on 01/05/2016   SUMMARY OF ONCOLOGIC HISTORY:   Malignant neoplasm of left female breast ([place])   07/30/2013 Mammogram    Mammogram- 5 cm from nipple, 1.6 x 1.2 x 1.0 cm spiculated, irregular mass in left breast at 11 o'clock position.      08/09/2013 Procedure    US- guided biopsy of left breast mass.      08/09/2013 Pathology Results    Invasive ductal carcinoma      08/20/2013 Procedure    Left lumpectomy and sentinel lymph node biopsy and modified axillary dissection by Dr. Anthony Sar.      08/20/2013 Pathology Results    Invasive tumor is 5 cm in largest dimension, grade 3, negative margins, 1/10 lymph node positive for micrometastasis and 1/10 lymph node positive for macro-metastasis, + LVI.  ER + > 90%, PR + > 90%, Ki-67 23%, HER2+ but ratio was 1.26.      09/27/2013 - 11/08/2013 Chemotherapy    AC x 4 cycles       12/06/2013 - 02/21/2014 Chemotherapy    Weekly Taxol x 12 cycles      04/02/2014 - 05/21/2014 Radiation Therapy    Dr. Pablo Ledger      05/27/2014 - 01/05/2016 Anti-estrogen oral therapy    Tamoxifen 20 mg daily.      01/05/2016 Adverse Reaction    Severe hot flashes.  She is 61 years old as of 01/04/2016.  She should be post-menopausal.      01/05/2016 -  Anti-estrogen oral therapy    Arimidex      02/02/2016 Imaging    Bone density- BMD as determined from Femur Neck Left is 0.927 g/cm2 with a T-Score of -0.8. This patient is considered normal according to [facility]) criteria.       CURRENT THERAPY:  INTERVAL HISTORY: Laura Moon 61 y.o. female returns for a malignant neoplasm of upper-inner quadrant of left breast, estrogen receptor positive.   [name] presents to the cancer center today unaccompanied.  On review of systems, the patient reports "bad" hot flashes which occur throughout the day with significant diaphoresis. The patient denies chest or abdominal pain. She reports weight gain of 2 lbs recently.   She is due for another mammogram in May, this has been scheduled.    MEDICAL HISTORY: Past Medical History:  Diagnosis Date  . Breast cancer, left ([place]) 01/05/2016  . Hypertension 2015    SURGICAL HISTORY: Past Surgical History:  Procedure Laterality Date  . BREAST BIOPSY Right 1998    SOCIAL HISTORY: Social History   Social History  . Marital status: Married    Spouse name: N/A  . Number of children: N/A  . Years of education: N/A   Occupational History  . Not on file.   Social History Main Topics  . Smoking status: Never Smoker  . Smokeless tobacco: Never Used  . Alcohol use No  . Drug use: No  . Sexual activity: Yes   Other Topics Concern  . Not on file   Social History Narrative  . No narrative on file    FAMILY HISTORY: Family History  Problem Relation Age of Onset  . Cancer Mother   .  COPD Father   . Emphysema Father   . Hypercholesterolemia Sister   . Hypertension Sister   . Cancer Brother   . Pulmonary embolism Maternal Aunt   . Diabetes Paternal Aunt   . Stroke Maternal Grandmother   . Stroke Maternal Grandfather   . Aneurysm Paternal Grandmother   . Stroke Paternal Grandfather   . Hypertension Daughter   . Edema Daughter     Review of Systems  Constitutional: Positive for diaphoresis.       Weight gain of 2 lbs recently. Severe hot flashes.  HENT: Negative.   Eyes: Negative.   Respiratory: Negative.   Cardiovascular: Negative for chest pain.  Gastrointestinal: Negative.  Negative for abdominal pain.  Genitourinary: Negative.   Musculoskeletal: Negative.   Skin: Negative.        Pos dry skin  Neurological:       Pos numbness in fingers  Endo/Heme/Allergies:  Negative.   Psychiatric/Behavioral: Negative.  The patient does not have insomnia.   All other systems reviewed and are negative. 14 point review of systems was performed and is negative except as detailed under history of present illness and above  Right breast without any masses, no nipple discharge, no axillary lymphadenopathy.  Well healed incision scar at the 12 o'clock position, no nipple discharge, masses, or axillary adenopathy.  PHYSICAL EXAMINATION  ECOG PERFORMANCE STATUS: 0 - Asymptomatic  Vitals:   06/28/16 1401  BP: (!) 159/103  Pulse: 87  Resp: 16  Temp: 97.7 F (36.5 C)     Physical Exam  Constitutional: She is oriented to person, place, and time and well-developed, well-nourished, and in no distress.  Pt was able to get on exam table without assistance.   HENT:  Head: Normocephalic and atraumatic.  Eyes: EOM are normal. Pupils are equal, round, and reactive to light.  Neck: Normal range of motion. Neck supple.  Cardiovascular: Normal rate, regular rhythm and normal heart sounds.  Exam reveals no gallop and no friction rub.   No murmur heard. Pulmonary/Chest: Effort normal and breath sounds normal. No respiratory distress. She has no wheezes. She has no rales. She exhibits no tenderness.    Abdominal: Soft. Bowel sounds are normal. She exhibits no distension and no mass. There is no tenderness. There is no rebound and no guarding.  Musculoskeletal: Normal range of motion. She exhibits no edema or tenderness.  Neurological: She is alert and oriented to person, place, and time. Gait normal.  Skin: Skin is warm and dry.  Psychiatric: Mood, memory, affect and judgment normal.  Nursing note and vitals reviewed.  LABORATORY DATA:  CBC    Component Value Date/Time   WBC 6.4 03/19/2016 1334   RBC 4.35 03/19/2016 1334   HGB 12.9 03/19/2016 1334   HCT 38.1 03/19/2016 1334   PLT 190 03/19/2016 1334   MCV 87.6 03/19/2016 1334   MCH 29.7 03/19/2016 1334   MCHC  33.9 03/19/2016 1334   RDW 13.8 03/19/2016 1334   LYMPHSABS 2.9 03/19/2016 1334   MONOABS 0.4 03/19/2016 1334   EOSABS 0.2 03/19/2016 1334   BASOSABS 0.0 03/19/2016 1334    CMP     Component Value Date/Time   NA 139 03/19/2016 1334   K 3.4 (L) 03/19/2016 1334   CL 105 03/19/2016 1334   CO2 28 03/19/2016 1334   GLUCOSE 133 (H) 03/19/2016 1334   BUN 14 03/19/2016 1334   CREATININE 0.84 03/19/2016 1334   CALCIUM 9.0 03/19/2016 1334   PROT 6.8 03/19/2016 1334  ALBUMIN 3.6 03/19/2016 1334   AST 21 03/19/2016 1334   ALT 17 03/19/2016 1334   ALKPHOS 67 03/19/2016 1334   BILITOT 0.4 03/19/2016 1334   GFRNONAA >60 03/19/2016 1334   GFRAA >60 03/19/2016 1334      PENDING LABS:   RADIOGRAPHIC STUDIES:  Study Result   EXAM: DUAL X-RAY ABSORPTIOMETRY (DXA) FOR BONE MINERAL DENSITY  IMPRESSION: Ordering Physician:  Dr. Baird Cancer,  Your patient Jocelyn Nold completed a BMD test on 02/02/2016 using the [place] (software version: 14.10) manufactured by [employer]. The following summarizes the results of our evaluation. PATIENT BIOGRAPHICAL: Name: HONG, TIMM Patient ID: [id number] Birth Date: October 17, 1955 Height: 62.5 in. Gender: Female Exam Date: 02/02/2016 Weight: 160.0 lbs. Indications: Height Loss, Hx Breast Ca, Post Menopausal Fractures: Treatments: Anastrozole, Vitamin D DENSITOMETRY RESULTS: Site      Region    Measured Date Measured Age WHO Classification Young Adult T-score BMD         %Change vs. Previous Significant Change (*) AP Spine L1-L4 02/02/2016 60.0 Normal 0.1 1.195 g/cm2 - -  DualFemur Neck Left 02/02/2016 60.0 Normal -0.8 0.927 g/cm2 - - ASSESSMENT: BMD as determined from Femur Neck Left is 0.927 g/cm2 with a T-Score of -0.8. This patient is considered normal according to [facility]) criteria.  World Pharmacologist (WHO) criteria for post-menopausal, Caucasian  Women: Normal:       T-score at or above -1 SD Osteopenia:   T-score between -1 and -2.5 SD Osteoporosis: T-score at or below -2.5 SD  RECOMMENDATIONS: [place] recommends that FDA-approved medial therapies be considered in postmenopausal women and men age 77 or older with a: 1. Hip or vertebral (clinical or morphometric) fracture. 2. T-Score of < -2.5 at the spine or hip. 3. Ten-year fracture probability by FRAX of 3% or greater for hip fracture or 20% or greater for major osteoporotic fracture.  All treatment decisions require clinical judgment and consideration of individual patient factors, including patient preferences, co-morbidities, previous drug use, risk factors not captured in the FRAX model (e.g. falls, vitamin D deficiency, increased bone turnover, interval significant decline in bone density) and possible under-or over-estimation of fracture risk by FRAX.  All patients should ensure an adequate intake of dietary calcium (1200 mg/d) and vitamin D (800 IU daily) unless contraindicated.  FOLLOW-UP: People with diagnosed cases of osteoporosis or osteopenia should be regularly tested for bone mineral density. For patients eligible for Medicare, routine testing is allowed once every 2 years. Testing frequency can be increased for patients who have rapidly progressing disease, or for those who are receiving medical therapy to restore bone mass.  I have reviewed this report, and agree with the above findings.  [facility]   Electronically Signed   By: Rolm Baptise M.D.   On: 02/02/2016 11:03      ASSESSMENT and THERAPY PLAN:  Malignant neoplasm of upper-inner quadrant of left breast, estrogen receptor positive Long term use of AI Normal Bone Density  PLAN: - I have prescribed celexa for hot flashes. I refilled the patient arimidex,TUMS, and nystatin prescription today.  - Repeat DEXA in 01/2018. - Patient will  proceed with scheduled routine mammogram in May. - RTC in 6 months for follow up with CBC and CMP.  All questions were answered. The patient knows to call the clinic with any problems, questions or concerns. We can certainly see the patient much sooner if necessary.  This document  serves as a record of services personally performed by Twana First, MD. It was created on her behalf by Maryla Morrow, a trained medical scribe. The creation of this record is based on the scribe's personal observations and the provider's statements to them. This document has been checked and approved by the attending provider.  I have reviewed the above documentation for accuracy and completeness and I agree with the above.  This note was electronically signed by:  Twana First, MD 06/28/2016

## 2016-07-02 ENCOUNTER — Telehealth (HOSPITAL_COMMUNITY): Payer: Self-pay

## 2016-07-02 DIAGNOSIS — C50212 Malignant neoplasm of upper-inner quadrant of left female breast: Secondary | ICD-10-CM

## 2016-07-02 DIAGNOSIS — Z170 Estrogen receptor positive status [ER+]: Principal | ICD-10-CM

## 2016-07-02 MED ORDER — CALCIUM CARBONATE ANTACID 500 MG PO CHEW: 2.0000 | CHEWABLE_TABLET | Freq: Every day | ORAL | 6 refills | Status: DC

## 2016-07-02 MED ORDER — ANASTROZOLE 1 MG PO TABS: 1.0000 mg | ORAL_TABLET | Freq: Every day | ORAL | 1 refills | Status: DC

## 2016-07-02 MED ORDER — NYSTATIN 100000 UNIT/GM EX POWD: CUTANEOUS | 2 refills | Status: DC

## 2016-07-02 NOTE — Telephone Encounter (Signed)
Patient called and said the prescriptions that Dr. Talbert Cage was supposed to send in were not at her pharmacy, [facility] in [place]. Checked chart and saw where the prescriptions were e-scribed to [facility]. Re[facility]. Patient verbalized understanding.

## 2016-07-06 ENCOUNTER — Telehealth (HOSPITAL_COMMUNITY): Payer: Self-pay | Admitting: *Deleted

## 2016-07-07 ENCOUNTER — Telehealth (HOSPITAL_COMMUNITY): Payer: Self-pay

## 2016-07-07 DIAGNOSIS — Z170 Estrogen receptor positive status [ER+]: Principal | ICD-10-CM

## 2016-07-07 DIAGNOSIS — C50112 Malignant neoplasm of central portion of left female breast: Secondary | ICD-10-CM

## 2016-07-07 MED ORDER — CITALOPRAM HYDROBROMIDE 10 MG PO TABS: 10.0000 mg | ORAL_TABLET | Freq: Every day | ORAL | 6 refills | Status: DC

## 2016-07-07 NOTE — Telephone Encounter (Signed)
Prescription had been e-scribed to the wrong pharmacy. Resent to correct pharmacy.

## 2016-07-07 NOTE — Telephone Encounter (Signed)
Patients prescription for celexa had been escribed to the wrong pharmacy. Resent to [facility] and patient notified with understanding verbalized.

## 2016-08-10 ENCOUNTER — Inpatient Hospital Stay (HOSPITAL_COMMUNITY): Admission: RE | Admit: 2016-08-10 | Payer: BLUE CROSS/BLUE SHIELD | Source: Ambulatory Visit

## 2016-08-10 ENCOUNTER — Ambulatory Visit (HOSPITAL_COMMUNITY)
Admission: RE | Admit: 2016-08-10 | Discharge: 2016-08-10 | Disposition: A | Discharge status: Home / Self Care | Payer: BLUE CROSS/BLUE SHIELD | Source: Ambulatory Visit | Attending: Oncology | Admitting: Oncology

## 2016-08-10 ENCOUNTER — Encounter (HOSPITAL_COMMUNITY): Payer: BLUE CROSS/BLUE SHIELD

## 2016-08-10 DIAGNOSIS — C50212 Malignant neoplasm of upper-inner quadrant of left female breast: Secondary | ICD-10-CM

## 2016-08-10 DIAGNOSIS — Z170 Estrogen receptor positive status [ER+]: Secondary | ICD-10-CM | POA: Insufficient documentation

## 2016-09-28 DIAGNOSIS — E785 Hyperlipidemia, unspecified: Secondary | ICD-10-CM | POA: Insufficient documentation

## 2016-10-01 ENCOUNTER — Ambulatory Visit: Payer: BLUE CROSS/BLUE SHIELD | Admitting: Family Medicine

## 2016-10-25 ENCOUNTER — Ambulatory Visit: Payer: BLUE CROSS/BLUE SHIELD | Admitting: Family Medicine

## 2016-10-28 ENCOUNTER — Other Ambulatory Visit (HOSPITAL_COMMUNITY): Payer: Self-pay

## 2016-10-28 DIAGNOSIS — C50112 Malignant neoplasm of central portion of left female breast: Secondary | ICD-10-CM

## 2016-10-28 DIAGNOSIS — Z170 Estrogen receptor positive status [ER+]: Principal | ICD-10-CM

## 2016-10-28 MED ORDER — CITALOPRAM HYDROBROMIDE 10 MG PO TABS
10.0000 mg | ORAL_TABLET | Freq: Every day | ORAL | 3 refills | Status: DC
Start: 2016-10-28 — End: 2017-06-28

## 2016-10-28 NOTE — Telephone Encounter (Signed)
Received refill request from patients pharmacy for 90 day supply of citalopram. Reviewed with provider, chart checked and refilled.

## 2016-12-10 ENCOUNTER — Encounter: Payer: Self-pay | Admitting: Family Medicine

## 2016-12-10 ENCOUNTER — Ambulatory Visit (INDEPENDENT_AMBULATORY_CARE_PROVIDER_SITE_OTHER): Payer: BLUE CROSS/BLUE SHIELD | Admitting: Family Medicine

## 2016-12-10 VITALS — BP 144/86 | HR 93 | Temp 97.0°F | Resp 16 | Ht 63.0 in | Wt 161.0 lb

## 2016-12-10 DIAGNOSIS — Z23 Encounter for immunization: Secondary | ICD-10-CM | POA: Diagnosis not present

## 2016-12-10 DIAGNOSIS — E559 Vitamin D deficiency, unspecified: Secondary | ICD-10-CM

## 2016-12-10 DIAGNOSIS — I8393 Asymptomatic varicose veins of bilateral lower extremities: Secondary | ICD-10-CM

## 2016-12-10 DIAGNOSIS — E785 Hyperlipidemia, unspecified: Secondary | ICD-10-CM

## 2016-12-10 DIAGNOSIS — I10 Essential (primary) hypertension: Secondary | ICD-10-CM | POA: Diagnosis not present

## 2016-12-10 DIAGNOSIS — F4323 Adjustment disorder with mixed anxiety and depressed mood: Secondary | ICD-10-CM | POA: Diagnosis not present

## 2016-12-10 DIAGNOSIS — R200 Anesthesia of skin: Secondary | ICD-10-CM | POA: Diagnosis not present

## 2016-12-10 DIAGNOSIS — Z1159 Encounter for screening for other viral diseases: Secondary | ICD-10-CM | POA: Diagnosis not present

## 2016-12-10 DIAGNOSIS — E119 Type 2 diabetes mellitus without complications: Secondary | ICD-10-CM

## 2016-12-10 DIAGNOSIS — R600 Localized edema: Secondary | ICD-10-CM

## 2016-12-10 NOTE — Patient Instructions (Addendum)
No change in medicine Due for lab tests I have ordered a nerve test for your hands See me in 4-6 weeks Need old records Dr Wenda Overland Need record OB/GYN for most recent PAP

## 2016-12-10 NOTE — Progress Notes (Signed)
Chief Complaint  Patient presents with  . Diabetes  . Hyperlipidemia  . Hypertension   New patient Well controlled DM Well controlled blood pressure - usually.  Up today.  Only takes HCTZ 12.5 daily. She is not on a statin.  Says she was taken off due to muscle pain.  We discussed lipids in the diabetic and the need to try another to see if it is tolerated Has pedal edema since a teenager.  Has varicose veins. Breast cancer survivor on arimidex Works long hours in the prison kitchen.  No reg exercise Is on citalopram for mood.  It works well Overdue for lab tests Has never had a colonoscopy mammo up to date PAP 2 years ago normal Will get old records New problem of both hands going numb L>R.  Worse with activity but also wakes u p at night  Patient Active Problem List   Diagnosis Date Noted  . Situational mixed anxiety and depressive disorder 12/10/2016  . Pedal edema 12/10/2016  . Varicose veins of both lower extremities 12/10/2016  . HLD (hyperlipidemia) 09/28/2016  . Hypertension 03/19/2016  . Malignant neoplasm of left female breast ([place]) 01/05/2016  . Port-a-cath in place 04/04/2014  . Diabetes mellitus without complication ([place]) 03/00/9233    Outpatient Encounter Prescriptions as of 12/10/2016  Medication Sig  . anastrozole (ARIMIDEX) 1 MG tablet Take 1 tablet (1 mg total) by mouth daily.  . calcium carbonate (TUMS - DOSED IN MG ELEMENTAL CALCIUM) 500 MG chewable tablet Chew 2 tablets (400 mg of elemental calcium total) by mouth daily.  . Cholecalciferol (D3 DOTS) 2000 units TBDP Take 1 capsule by mouth daily.  . citalopram (CELEXA) 10 MG tablet Take 1 tablet (10 mg total) by mouth daily.  Marland Kitchen glimepiride (AMARYL) 1 MG tablet Take 0.5 tablets (0.5 mg total) by mouth daily with breakfast.  . hydrochlorothiazide (MICROZIDE) 12.5 MG capsule Take 1 capsule (12.5 mg total) by mouth daily.  Marland Kitchen nystatin (MYCOSTATIN/NYSTOP) powder Apply powder under breasts twice daily  .  potassium chloride SA (K-DUR,KLOR-CON) 20 MEQ tablet Take 1 tablet (20 mEq total) by mouth 2 (two) times daily.   No facility-administered encounter medications on file as of 12/10/2016.     Past Medical History:  Diagnosis Date  . Breast cancer, left ([place]) 01/05/2016   breast  . Diabetes mellitus without complication ([place])   . Hyperlipidemia   . Hypertension 2015    Past Surgical History:  Procedure Laterality Date  . BREAST BIOPSY Right 1998  . TUBAL LIGATION      Social History   Social History  . Marital status: Divorced    Spouse name: N/A  . Number of children: 2  . Years of education: ged   Occupational History  . dietician aid     [employer]   Social History Main Topics  . Smoking status: Never Smoker  . Smokeless tobacco: Never Used  . Alcohol use No  . Drug use: No  . Sexual activity: Yes    Birth control/ protection: Post-menopausal   Other Topics Concern  . Not on file   Social History Narrative   Lives alone   Two daughters are grown   Sometimes stays with sister [place]    Family History  Problem Relation Age of Onset  . Cancer Mother        breast  . COPD Father   . Emphysema Father   . Hypertension Sister   . Hyperlipidemia Sister   .  Cancer Brother        lung  . Pulmonary embolism Maternal Aunt   . Diabetes Paternal Aunt   . Stroke Maternal Grandmother   . Stroke Maternal Grandfather   . Aneurysm Paternal Grandmother   . Stroke Paternal Grandfather   . Early death Brother 38       murder  . Hypertension Daughter   . Edema Daughter     Review of Systems  Constitutional: Negative for chills, fever and weight loss.  HENT: Negative for congestion and hearing loss.   Eyes: Negative for blurred vision and pain.  Respiratory: Negative for cough and shortness of breath.   Cardiovascular: Negative for chest pain and leg swelling.  Gastrointestinal: Negative for abdominal pain, constipation, diarrhea and heartburn.    Genitourinary: Negative for dysuria and frequency.  Musculoskeletal: Negative for falls, joint pain and myalgias.  Neurological: Negative for dizziness, seizures and headaches.  Psychiatric/Behavioral: Negative for depression. The patient is not nervous/anxious and does not have insomnia.     BP (!) 144/86 (BP Location: Right Arm)   Pulse 93   Temp (!) 97 F (36.1 C) (Other (Comment))   Resp 16   Ht 5\' 3"  (1.6 m)   Wt 161 lb (73 kg)   SpO2 98%   BMI 28.52 kg/m   Physical Exam  Constitutional: She is oriented to person, place, and time. She appears well-developed and well-nourished.  HENT:  Head: Normocephalic and atraumatic.  Mouth/Throat: Oropharynx is clear and moist.  Eyes: Pupils are equal, round, and reactive to light. Conjunctivae are normal.  Neck: Normal range of motion. Neck supple. No thyromegaly present.  Cardiovascular: Normal rate, regular rhythm and normal heart sounds.   Pulmonary/Chest: Effort normal and breath sounds normal. No respiratory distress.  Scar right ant chest  Abdominal: Soft. Bowel sounds are normal.  Musculoskeletal: Normal range of motion. She exhibits edema.  2+ edema, varicose veins R>L  Lymphadenopathy:    She has no cervical adenopathy.  Neurological: She is alert and oriented to person, place, and time.  Gait normal.  POS phalens both wrists  Skin: Skin is warm and dry.  Psychiatric: She has a normal mood and affect. Her behavior is normal. Thought content normal.  Nursing note and vitals reviewed. ASSESSMENT/PLAN:   1. Essential hypertension Will follow  2. Diabetes mellitus without complication ([place]) Needs labs - Hemoglobin A1c - COMPLETE METABOLIC PANEL WITH GFR - CBC with Differential/Platelet  3. Hyperlipidemia, unspecified hyperlipidemia type Needs statin - Lipid panel  4. Situational mixed anxiety and depressive disorder Stable on citalopram  5. Bilateral hand numbness Needs NCS and referral - Nerve conduction  test; Future  6. Pedal edema chronic  7. Varicose veins of both lower extremities  8. Encounter for hepatitis C screening test for low risk patient - Hepatitis C antibody  9. Vitamin D deficiency - VITAMIN D 25 Hydroxy (Vit-D Deficiency, Fractures)  10. Need for immunization against influenza - Flu Vaccine QUAD 36+ mos IM   Patient Instructions  No change in medicine Due for lab tests I have ordered a nerve test for your hands See me in 4-6 weeks Need old records Dr Wenda Overland Need record OB/GYN for most recent PAP   Raylene Everts, MD

## 2016-12-11 LAB — CBC WITH DIFFERENTIAL/PLATELET
BASOS PCT: 0.6 %
Basophils Absolute: 40 cells/uL (ref 0–200)
EOS PCT: 2.4 %
Eosinophils Absolute: 158 cells/uL (ref 15–500)
HCT: 39.7 % (ref 35.0–45.0)
HEMOGLOBIN: 13.3 g/dL (ref 11.7–15.5)
LYMPHS ABS: 3029 {cells}/uL (ref 850–3900)
MCH: 28 pg (ref 27.0–33.0)
MCHC: 33.5 g/dL (ref 32.0–36.0)
MCV: 83.6 fL (ref 80.0–100.0)
MPV: 10.4 fL (ref 7.5–12.5)
Monocytes Relative: 8.1 %
NEUTROS ABS: 2838 {cells}/uL (ref 1500–7800)
NEUTROS PCT: 43 %
Platelets: 215 10*3/uL (ref 140–400)
RBC: 4.75 10*6/uL (ref 3.80–5.10)
RDW: 14 % (ref 11.0–15.0)
Total Lymphocyte: 45.9 %
WBC: 6.6 10*3/uL (ref 3.8–10.8)
WBCMIX: 535 {cells}/uL (ref 200–950)

## 2016-12-11 LAB — COMPLETE METABOLIC PANEL WITH GFR
AG Ratio: 1.4 (calc) (ref 1.0–2.5)
ALBUMIN MSPROF: 4.2 g/dL (ref 3.6–5.1)
ALKALINE PHOSPHATASE (APISO): 102 U/L (ref 33–130)
ALT: 14 U/L (ref 6–29)
AST: 14 U/L (ref 10–35)
BILIRUBIN TOTAL: 0.3 mg/dL (ref 0.2–1.2)
BUN: 18 mg/dL (ref 7–25)
CHLORIDE: 105 mmol/L (ref 98–110)
CO2: 28 mmol/L (ref 20–32)
CREATININE: 0.84 mg/dL (ref 0.50–1.05)
Calcium: 9.6 mg/dL (ref 8.6–10.4)
GFR, Est African American: 88 mL/min/{1.73_m2} (ref >=60)
GFR, Est Non African American: 76 mL/min/{1.73_m2} (ref >=60)
GLUCOSE: 89 mg/dL (ref 65–139)
Globulin: 2.9 g/dL (calc) (ref 1.9–3.7)
Potassium: 3.5 mmol/L (ref 3.5–5.3)
Sodium: 141 mmol/L (ref 135–146)
TOTAL PROTEIN: 7.1 g/dL (ref 6.1–8.1)

## 2016-12-11 LAB — HEPATITIS C ANTIBODY
Hepatitis C Ab: NONREACTIVE
SIGNAL TO CUT-OFF: 0.01 (ref <1.00)

## 2016-12-11 LAB — HEMOGLOBIN A1C
EAG (MMOL/L): 7.1 (calc)
Hgb A1c MFr Bld: 6.1 % of total Hgb — ABNORMAL HIGH (ref <5.7)
Mean Plasma Glucose: 128 (calc)

## 2016-12-11 LAB — VITAMIN D 25 HYDROXY (VIT D DEFICIENCY, FRACTURES): Vit D, 25-Hydroxy: 34 ng/mL (ref 30–100)

## 2016-12-11 LAB — LIPID PANEL
CHOLESTEROL: 260 mg/dL — AB (ref <200)
HDL: 43 mg/dL — AB (ref >50)
LDL CHOLESTEROL (CALC): 184 mg/dL — AB
Non-HDL Cholesterol (Calc): 217 mg/dL (calc) — ABNORMAL HIGH (ref <130)
TRIGLYCERIDES: 180 mg/dL — AB (ref <150)
Total CHOL/HDL Ratio: 6 (calc) — ABNORMAL HIGH (ref <5.0)

## 2016-12-14 ENCOUNTER — Encounter: Payer: Self-pay | Admitting: Family Medicine

## 2016-12-14 MED ORDER — ATORVASTATIN CALCIUM 20 MG PO TABS: 20.0000 mg | ORAL_TABLET | Freq: Every day | ORAL | 3 refills | Status: AC

## 2016-12-27 ENCOUNTER — Encounter (HOSPITAL_COMMUNITY): Payer: BLUE CROSS/BLUE SHIELD | Attending: Oncology

## 2016-12-27 DIAGNOSIS — Z170 Estrogen receptor positive status [ER+]: Secondary | ICD-10-CM | POA: Insufficient documentation

## 2016-12-27 DIAGNOSIS — C50112 Malignant neoplasm of central portion of left female breast: Secondary | ICD-10-CM | POA: Insufficient documentation

## 2016-12-27 LAB — CBC WITH DIFFERENTIAL/PLATELET
Basophils Absolute: 0 10*3/uL (ref 0.0–0.1)
Basophils Relative: 1 %
EOS ABS: 0.2 10*3/uL (ref 0.0–0.7)
EOS PCT: 3 %
HCT: 36.6 % (ref 36.0–46.0)
Hemoglobin: 12.3 g/dL (ref 12.0–15.0)
LYMPHS ABS: 3.1 10*3/uL (ref 0.7–4.0)
Lymphocytes Relative: 46 %
MCH: 28.6 pg (ref 26.0–34.0)
MCHC: 33.6 g/dL (ref 30.0–36.0)
MCV: 85.1 fL (ref 78.0–100.0)
MONO ABS: 0.4 10*3/uL (ref 0.1–1.0)
MONOS PCT: 7 %
Neutro Abs: 2.8 10*3/uL (ref 1.7–7.7)
Neutrophils Relative %: 43 %
PLATELETS: 192 10*3/uL (ref 150–400)
RBC: 4.3 MIL/uL (ref 3.87–5.11)
RDW: 13.8 % (ref 11.5–15.5)
WBC: 6.6 10*3/uL (ref 4.0–10.5)

## 2016-12-27 LAB — COMPREHENSIVE METABOLIC PANEL
ALT: 15 U/L (ref 14–54)
ANION GAP: 7 (ref 5–15)
AST: 17 U/L (ref 15–41)
Albumin: 3.7 g/dL (ref 3.5–5.0)
Alkaline Phosphatase: 94 U/L (ref 38–126)
BUN: 16 mg/dL (ref 6–20)
CHLORIDE: 104 mmol/L (ref 101–111)
CO2: 28 mmol/L (ref 22–32)
Calcium: 9 mg/dL (ref 8.9–10.3)
Creatinine, Ser: 1.03 mg/dL — ABNORMAL HIGH (ref 0.44–1.00)
GFR, EST NON AFRICAN AMERICAN: 58 mL/min — AB (ref >60)
Glucose, Bld: 109 mg/dL — ABNORMAL HIGH (ref 65–99)
POTASSIUM: 3.3 mmol/L — AB (ref 3.5–5.1)
SODIUM: 139 mmol/L (ref 135–145)
Total Bilirubin: 0.8 mg/dL (ref 0.3–1.2)
Total Protein: 7 g/dL (ref 6.5–8.1)

## 2016-12-28 ENCOUNTER — Encounter (HOSPITAL_COMMUNITY): Payer: Self-pay

## 2016-12-28 ENCOUNTER — Encounter (HOSPITAL_COMMUNITY): Payer: BLUE CROSS/BLUE SHIELD | Attending: Oncology | Admitting: Oncology

## 2016-12-28 VITALS — BP 143/95 | HR 82 | Resp 16 | Ht 63.0 in | Wt 159.5 lb

## 2016-12-28 DIAGNOSIS — C50312 Malignant neoplasm of lower-inner quadrant of left female breast: Secondary | ICD-10-CM

## 2016-12-28 DIAGNOSIS — Z170 Estrogen receptor positive status [ER+]: Secondary | ICD-10-CM | POA: Diagnosis not present

## 2016-12-28 DIAGNOSIS — N951 Menopausal and female climacteric states: Secondary | ICD-10-CM

## 2016-12-28 DIAGNOSIS — Z79811 Long term (current) use of aromatase inhibitors: Secondary | ICD-10-CM

## 2016-12-28 NOTE — Progress Notes (Signed)
PROGRESS NOTE  Laura Everts, MD [phone] / [place] [address]   DIAGNOSIS: Cancer Staging Malignant neoplasm of left female breast [facility]) Staging form: Breast, AJCC 7th Edition - Clinical stage from 07/30/2013: Stage IIB (T2, N1, M0) - Signed by Baird Cancer, PA-C on 01/05/2016   SUMMARY OF ONCOLOGIC HISTORY:   Malignant neoplasm of left female breast ([place])   07/30/2013 Mammogram    Mammogram- 5 cm from nipple, 1.6 x 1.2 x 1.0 cm spiculated, irregular mass in left breast at 11 o'clock position.      08/09/2013 Procedure    US- guided biopsy of left breast mass.      08/09/2013 Pathology Results    Invasive ductal carcinoma      08/20/2013 Procedure    Left lumpectomy and sentinel lymph node biopsy and modified axillary dissection by Dr. Anthony Sar.      08/20/2013 Pathology Results    Invasive tumor is 5 cm in largest dimension, grade 3, negative margins, 1/10 lymph node positive for micrometastasis and 1/10 lymph node positive for macro-metastasis, + LVI.  ER + > 90%, PR + > 90%, Ki-67 23%, HER2+ but ratio was 1.26.      09/27/2013 - 11/08/2013 Chemotherapy    AC x 4 cycles       12/06/2013 - 02/21/2014 Chemotherapy    Weekly Taxol x 12 cycles      04/02/2014 - 05/21/2014 Radiation Therapy    Dr. Pablo Ledger      05/27/2014 - 01/05/2016 Anti-estrogen oral therapy    Tamoxifen 20 mg daily.      01/05/2016 Adverse Reaction    Severe hot flashes.  She is 61 years old as of 01/04/2016.  She should be post-menopausal.      01/05/2016 -  Anti-estrogen oral therapy    Arimidex      02/02/2016 Imaging    Bone density- BMD as determined from Femur Neck Left is 0.927 g/cm2 with a T-Score of -0.8. This patient is considered normal according to [facility]) criteria.       CURRENT THERAPY:  INTERVAL HISTORY: Laura Moon 61 y.o. 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   Patient has been doing well. She has some chronic fatigue and leg swelling but otherwise no complaints. She states she has a hard time taking the TUMS because she does not like the chalky flavor. Denies any severe hot flashes, joint pain, chest pain, shortness of breath, abdominal pain, new breast masses.  MEDICAL HISTORY: Past Medical History:  Diagnosis Date  . Breast cancer, left ([place]) 01/05/2016   breast  . Diabetes mellitus without complication ([place])   . Hyperlipidemia   . Hypertension 2015    SURGICAL HISTORY: Past Surgical History:  Procedure Laterality Date  . BREAST BIOPSY Right 1998  . TUBAL LIGATION      SOCIAL HISTORY: Social History   Social History  . Marital status: Divorced    Spouse name: N/A  . Number of children: 2  . Years of education: ged   Occupational History  . dietician aid     [employer]   Social History Main Topics  . Smoking status: Never Smoker  . Smokeless tobacco: Never Used  . Alcohol use No  . Drug use: No  . Sexual activity: Yes    Birth control/ protection: Post-menopausal   Other Topics Concern  . Not on file  Social History Narrative   Lives alone   Two daughters are grown   Sometimes stays with sister Clawson    FAMILY HISTORY: Family History  Problem Relation Age of Onset  . Cancer Mother        breast  . COPD Father   . Emphysema Father   . Hypertension Sister   . Hyperlipidemia Sister   . Cancer Brother        lung  . Pulmonary embolism Maternal Aunt   . Diabetes Paternal Aunt   . Stroke Maternal Grandmother   . Stroke Maternal Grandfather   . Aneurysm Paternal Grandmother   . Stroke Paternal Grandfather   . Early death Brother 59       murder  . Hypertension Daughter   . Edema Daughter     Review of Systems  Constitutional: Positive for malaise/fatigue. Negative for diaphoresis.  HENT: Negative.   Eyes: Negative.   Respiratory: Negative.   Cardiovascular: Positive for leg  swelling. Negative for chest pain.  Gastrointestinal: Negative.  Negative for abdominal pain.  Genitourinary: Negative.   Musculoskeletal: Negative.   Skin: Negative.   Endo/Heme/Allergies: Negative.   Psychiatric/Behavioral: Negative.  The patient does not have insomnia.   All other systems reviewed and are negative. 14 point review of systems was performed and is negative except as detailed under history of present illness and above    PHYSICAL EXAMINATION  ECOG PERFORMANCE STATUS: 0 - Asymptomatic  Vitals:   12/28/16 1354  BP: (!) 143/95  Pulse: 82  Resp: 16  SpO2: 100%     Physical Exam  Constitutional: She is oriented to person, place, and time and well-developed, well-nourished, and in no distress.  Pt was able to get on exam table without assistance.   HENT:  Head: Normocephalic and atraumatic.  Eyes: Pupils are equal, round, and reactive to light. EOM are normal.  Neck: Normal range of motion. Neck supple.  Cardiovascular: Normal rate, regular rhythm and normal heart sounds.  Exam reveals no gallop and no friction rub.   No murmur heard. Pulmonary/Chest: Effort normal and breath sounds normal. No respiratory distress. She has no wheezes. She has no rales. She exhibits no tenderness.    Abdominal: Soft. Bowel sounds are normal. She exhibits no distension and no mass. There is no tenderness. There is no rebound and no guarding.  Musculoskeletal: Normal range of motion. She exhibits no edema or tenderness.  Neurological: She is alert and oriented to person, place, and time. Gait normal.  Skin: Skin is warm and dry.  Psychiatric: Mood, memory, affect and judgment normal.  Nursing note and vitals reviewed.  LABORATORY DATA:  CBC    Component Value Date/Time   WBC 6.6 12/27/2016 1202   RBC 4.30 12/27/2016 1202   HGB 12.3 12/27/2016 1202   HCT 36.6 12/27/2016 1202   PLT 192 12/27/2016 1202   MCV 85.1 12/27/2016 1202   MCH 28.6 12/27/2016 1202   MCHC 33.6  12/27/2016 1202   RDW 13.8 12/27/2016 1202   LYMPHSABS 3.1 12/27/2016 1202   MONOABS 0.4 12/27/2016 1202   EOSABS 0.2 12/27/2016 1202   BASOSABS 0.0 12/27/2016 1202    CMP     Component Value Date/Time   NA 139 12/27/2016 1202   K 3.3 (L) 12/27/2016 1202   CL 104 12/27/2016 1202   CO2 28 12/27/2016 1202   GLUCOSE 109 (H) 12/27/2016 1202   BUN 16 12/27/2016 1202   CREATININE 1.03 (H) 12/27/2016 1202   CREATININE  0.84 12/10/2016 1500   CALCIUM 9.0 12/27/2016 1202   PROT 7.0 12/27/2016 1202   ALBUMIN 3.7 12/27/2016 1202   AST 17 12/27/2016 1202   ALT 15 12/27/2016 1202   ALKPHOS 94 12/27/2016 1202   BILITOT 0.8 12/27/2016 1202   GFRNONAA 58 (L) 12/27/2016 1202   GFRNONAA 76 12/10/2016 1500   GFRAA >60 12/27/2016 1202   GFRAA 88 12/10/2016 1500      PENDING LABS:   RADIOGRAPHIC STUDIES:  Study Result   EXAM: DUAL X-RAY ABSORPTIOMETRY (DXA) FOR BONE MINERAL DENSITY  IMPRESSION: Ordering Physician:  Dr. Baird Cancer,  Your patient Laura Moon completed a BMD test on 02/02/2016 using the [place] (software version: 14.10) manufactured by [employer]. The following summarizes the results of our evaluation. PATIENT BIOGRAPHICAL: Name: Laura Moon, Laura Moon Patient ID: [id number] Birth Date: 07-Nov-1955 Height: 62.5 in. Gender: Female Exam Date: 02/02/2016 Weight: 160.0 lbs. Indications: Height Loss, Hx Breast Ca, Post Menopausal Fractures: Treatments: Anastrozole, Vitamin D DENSITOMETRY RESULTS: Site      Region    Measured Date Measured Age WHO Classification Young Adult T-score BMD         %Change vs. Previous Significant Change (*) AP Spine L1-L4 02/02/2016 60.0 Normal 0.1 1.195 g/cm2 - -  DualFemur Neck Left 02/02/2016 60.0 Normal -0.8 0.927 g/cm2 - - ASSESSMENT: BMD as determined from Femur Neck Left is 0.927 g/cm2 with a T-Score of -0.8. This patient is considered normal according to [facility])  criteria.  World Pharmacologist (WHO) criteria for post-menopausal, Caucasian Women: Normal:       T-score at or above -1 SD Osteopenia:   T-score between -1 and -2.5 SD Osteoporosis: T-score at or below -2.5 SD  RECOMMENDATIONS: [place] recommends that FDA-approved medial therapies be considered in postmenopausal women and men age 25 or older with a: 1. Hip or vertebral (clinical or morphometric) fracture. 2. T-Score of < -2.5 at the spine or hip. 3. Ten-year fracture probability by FRAX of 3% or greater for hip fracture or 20% or greater for major osteoporotic fracture.  All treatment decisions require clinical judgment and consideration of individual patient factors, including patient preferences, co-morbidities, previous drug use, risk factors not captured in the FRAX model (e.g. falls, vitamin D deficiency, increased bone turnover, interval significant decline in bone density) and possible under-or over-estimation of fracture risk by FRAX.  All patients should ensure an adequate intake of dietary calcium (1200 mg/d) and vitamin D (800 IU daily) unless contraindicated.  FOLLOW-UP: People with diagnosed cases of osteoporosis or osteopenia should be regularly tested for bone mineral density. For patients eligible for Medicare, routine testing is allowed once every 2 years. Testing frequency can be increased for patients who have rapidly progressing disease, or for those who are receiving medical therapy to restore bone mass.  I have reviewed this report, and agree with the above findings.  [facility]   Electronically Signed   By: Rolm Baptise M.D.   On: 02/02/2016 11:03      ASSESSMENT and THERAPY PLAN:  Malignant neoplasm of upper-inner quadrant of left breast, estrogen receptor positive Long term use of AI Normal Bone Density  PLAN: -Continue arimidex. Recommended she take oscal-D for calcium  supplementation.  -Continue celexa for hot flashes. - Repeat DEXA in 01/2018. - Mammogram from 08/10/16 was birads 2. Patient will proceed with scheduled routine b/l diagnostic mammogram in May 2019. - Labs reviewed with patient today. -RTC  in 6 months for follow up with CBC and CMP.  All questions were answered. The patient knows to call the clinic with any problems, questions or concerns. We can certainly see the patient much sooner if necessary.  This document serves as a record of services personally performed by Twana First, MD. It was created on her behalf by Maryla Morrow, a trained medical scribe. The creation of this record is based on the scribe's personal observations and the provider's statements to them. This document has been checked and approved by the attending provider.  I have reviewed the above documentation for accuracy and completeness and I agree with the above.  This note was electronically signed by:  Twana First, MD 12/28/2016

## 2017-01-14 ENCOUNTER — Ambulatory Visit: Payer: BLUE CROSS/BLUE SHIELD | Admitting: Family Medicine

## 2017-01-24 ENCOUNTER — Ambulatory Visit: Payer: BLUE CROSS/BLUE SHIELD | Admitting: Family Medicine

## 2017-01-24 ENCOUNTER — Encounter: Payer: Self-pay | Admitting: Family Medicine

## 2017-01-24 ENCOUNTER — Other Ambulatory Visit: Payer: Self-pay

## 2017-01-24 VITALS — BP 136/86 | HR 76 | Temp 97.9°F | Resp 16 | Ht 63.0 in | Wt 162.0 lb

## 2017-01-24 DIAGNOSIS — E785 Hyperlipidemia, unspecified: Secondary | ICD-10-CM

## 2017-01-24 DIAGNOSIS — E119 Type 2 diabetes mellitus without complications: Secondary | ICD-10-CM

## 2017-01-24 DIAGNOSIS — I10 Essential (primary) hypertension: Secondary | ICD-10-CM

## 2017-01-24 DIAGNOSIS — R200 Anesthesia of skin: Secondary | ICD-10-CM | POA: Diagnosis not present

## 2017-01-24 DIAGNOSIS — Z1211 Encounter for screening for malignant neoplasm of colon: Secondary | ICD-10-CM | POA: Diagnosis not present

## 2017-01-24 MED ORDER — HYDROCHLOROTHIAZIDE 12.5 MG PO CAPS: 12.5000 mg | ORAL_CAPSULE | Freq: Every day | ORAL | 3 refills | Status: AC

## 2017-01-24 NOTE — Progress Notes (Signed)
Chief Complaint  Patient presents with  . Follow-up    1 month   Patient is here for follow-up. Her blood pressure is well controlled. She is compliant with her medications. She still complains of numbness of both of her hands.  Right greater than left.  She has well-controlled diabetes.  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  She thinks that her hand numbness is work-related.  I told her that she needs to report this to her supervisor as a work related illness and have it evaluated under [employer]  If Worker's Comp. is accepted, they will be repaired.  If it is denied then we can pursue workup and treatment under her private insurance. Her mammogram is up-to-date.  Her Pap was within 2 years.  She is never had a colonoscopy.  She agrees to a cologuard and this is ordered. Her lab results were reviewed.  Her diabetes is well controlled with an A1c of 6.1.  Other results are normal as expected.  Patient Active Problem List   Diagnosis Date Noted  . Situational mixed anxiety and depressive disorder 12/10/2016  . Pedal edema 12/10/2016  . Varicose veins of both lower extremities 12/10/2016  . HLD (hyperlipidemia) 09/28/2016  . Hypertension 03/19/2016  . Malignant neoplasm of left female breast ([place]) 01/05/2016  . Port-A-Cath in place 04/04/2014  . Diabetes mellitus without complication ([place]) 16/12/9602    Outpatient Encounter Medications as of 01/24/2017  Medication Sig  . anastrozole (ARIMIDEX) 1 MG tablet Take 1 tablet (1 mg total) by mouth daily.  Marland Kitchen atorvastatin (LIPITOR) 20 MG tablet Take 1 tablet (20 mg total) by mouth daily.  . Cholecalciferol (D3 DOTS) 2000 units TBDP Take 1 capsule by mouth daily.  . citalopram (CELEXA) 10 MG tablet Take 1 tablet (10 mg total) by mouth daily.  Marland Kitchen glimepiride (AMARYL) 1 MG tablet Take 0.5 tablets (0.5 mg total) by mouth daily with breakfast.  . hydrochlorothiazide  (MICROZIDE) 12.5 MG capsule Take 1 capsule (12.5 mg total) daily by mouth.  . nystatin (MYCOSTATIN/NYSTOP) powder Apply powder under breasts twice daily  . potassium chloride SA (K-DUR,KLOR-CON) 20 MEQ tablet Take 1 tablet (20 mEq total) by mouth 2 (two) times daily.   No facility-administered encounter medications on file as of 01/24/2017.     Allergies  Allergen Reactions  . Effexor [Venlafaxine] Nausea And Vomiting    GI intolerance only  . Hydrocodone Nausea And Vomiting    GI intolerance only    Review of Systems  Constitutional: Negative for activity change, appetite change and unexpected weight change.  HENT: Negative for congestion, dental problem, postnasal drip and rhinorrhea.   Eyes: Negative for redness and visual disturbance.  Respiratory: Negative for cough and shortness of breath.   Cardiovascular: Negative for chest pain, palpitations and leg swelling.  Gastrointestinal: Negative for abdominal pain, constipation and diarrhea.  Genitourinary: Negative for difficulty urinating, frequency and vaginal bleeding.  Musculoskeletal: Negative for arthralgias and back pain.  Neurological: Positive for numbness. Negative for dizziness and headaches.  Psychiatric/Behavioral: Negative for dysphoric mood and sleep disturbance. The patient is not nervous/anxious.     BP 136/86 (BP Location: Right Arm, Patient Position: Sitting, Cuff Size: Normal)   Pulse 76   Temp 97.9 F (36.6 C) (Temporal)   Resp 16   Ht 5\' 3"  (1.6 m)   Wt 162 lb 0.6 oz (73.5 kg)   SpO2 98%   BMI  28.70 kg/m   Physical Exam  Constitutional: She is oriented to person, place, and time. She appears well-developed and well-nourished.  HENT:  Head: Normocephalic and atraumatic.  Mouth/Throat: Oropharynx is clear and moist.  Eyes: Conjunctivae are normal. Pupils are equal, round, and reactive to light.  Neck: Normal range of motion. Neck supple. No thyromegaly present.  Cardiovascular: Normal rate, regular  rhythm and normal heart sounds.  Pulmonary/Chest: Effort normal and breath sounds normal. No respiratory distress.  Scar right ant chest  Abdominal: Soft. Bowel sounds are normal.  Musculoskeletal: Normal range of motion. She exhibits edema.  Trace edema, varicose veins R>L  Lymphadenopathy:    She has no cervical adenopathy.  Neurological: She is alert and oriented to person, place, and time.  Gait normal.  POS phalens both wrists  Skin: Skin is warm and dry.  Psychiatric: She has a normal mood and affect. Her behavior is normal. Thought content normal.  Nursing note and vitals reviewed.   ASSESSMENT/PLAN:  1. Essential hypertension Well-controlled  2. Diabetes mellitus without complication ([place]) Controlled with hemoglobin A1c of 6.1 - CBC - Comprehensive metabolic panel - Hemoglobin A1c - Lipid panel - Microalbumin / creatinine urine ratio - Urinalysis, Routine w reflex microscopic  3. Hyperlipidemia, unspecified hyperlipidemia type Controlled.  Compliant with statin  4. Screen for colon cancer Patient has never had colon cancer screening.  No colon cancer in family.  This is a low risk study. - Cologuard  5. Bilateral hand numbness Patient feels she has carpal tunnel syndrome related to her work activities.  She was advised as above   Patient Instructions  See me in six months Continue with good eating and exercise habits Call sooner for problems   Raylene Everts, MD

## 2017-01-24 NOTE — Patient Instructions (Signed)
See me in six months Continue with good eating and exercise habits Call sooner for problems

## 2017-02-02 ENCOUNTER — Other Ambulatory Visit (HOSPITAL_COMMUNITY): Payer: Self-pay

## 2017-02-02 DIAGNOSIS — Z170 Estrogen receptor positive status [ER+]: Principal | ICD-10-CM

## 2017-02-02 DIAGNOSIS — C50212 Malignant neoplasm of upper-inner quadrant of left female breast: Secondary | ICD-10-CM

## 2017-02-02 MED ORDER — ANASTROZOLE 1 MG PO TABS: 1.0000 mg | ORAL_TABLET | Freq: Every day | ORAL | 1 refills | Status: DC

## 2017-02-02 NOTE — Telephone Encounter (Signed)
Patient called for refill on arimidex. Reviewed with provider, chart checked and refilled. 

## 2017-02-17 ENCOUNTER — Other Ambulatory Visit (HOSPITAL_COMMUNITY): Payer: Self-pay

## 2017-02-17 DIAGNOSIS — C50212 Malignant neoplasm of upper-inner quadrant of left female breast: Secondary | ICD-10-CM

## 2017-02-17 DIAGNOSIS — Z170 Estrogen receptor positive status [ER+]: Principal | ICD-10-CM

## 2017-02-17 MED ORDER — POTASSIUM CHLORIDE CRYS ER 20 MEQ PO TBCR: 20.0000 meq | EXTENDED_RELEASE_TABLET | Freq: Two times a day (BID) | ORAL | 1 refills | Status: AC

## 2017-02-17 NOTE — Telephone Encounter (Signed)
Received refill request from patients pharmacy for potassium. Reviewed with provider, chart checked and refilled.  

## 2017-03-03 ENCOUNTER — Other Ambulatory Visit: Payer: Self-pay | Admitting: Family Medicine

## 2017-04-28 ENCOUNTER — Telehealth: Payer: Self-pay | Admitting: Family Medicine

## 2017-04-28 NOTE — Telephone Encounter (Signed)
Patient called in and stated her pharmacy [facility] in [place] said that her prescription was denied but they did not tell her which prescription.  [phone]

## 2017-05-02 ENCOUNTER — Telehealth: Payer: Self-pay | Admitting: Family Medicine

## 2017-05-02 NOTE — Telephone Encounter (Signed)
Called pt, she is looking for a refill of her Laura Moon, you have not written this before for her.

## 2017-05-02 NOTE — Telephone Encounter (Signed)
I am not aware that she takes Ambien.  I have not prescribed this for her.  I do not recommend it

## 2017-05-02 NOTE — Telephone Encounter (Signed)
Patient is requesting refill for sleeping pills. Callback #: [phone]

## 2017-05-02 NOTE — Telephone Encounter (Signed)
Selena spoke with patient today

## 2017-05-03 NOTE — Telephone Encounter (Signed)
Called Maikayla, her voice mail is not set up.

## 2017-06-20 ENCOUNTER — Other Ambulatory Visit (HOSPITAL_COMMUNITY): Payer: Self-pay | Admitting: *Deleted

## 2017-06-20 DIAGNOSIS — C50219 Malignant neoplasm of upper-inner quadrant of unspecified female breast: Secondary | ICD-10-CM

## 2017-06-21 ENCOUNTER — Inpatient Hospital Stay (HOSPITAL_COMMUNITY): Payer: BLUE CROSS/BLUE SHIELD | Attending: Internal Medicine

## 2017-06-21 DIAGNOSIS — Z923 Personal history of irradiation: Secondary | ICD-10-CM | POA: Diagnosis not present

## 2017-06-21 DIAGNOSIS — Z79899 Other long term (current) drug therapy: Secondary | ICD-10-CM | POA: Insufficient documentation

## 2017-06-21 DIAGNOSIS — N951 Menopausal and female climacteric states: Secondary | ICD-10-CM | POA: Diagnosis present

## 2017-06-21 DIAGNOSIS — Z9221 Personal history of antineoplastic chemotherapy: Secondary | ICD-10-CM | POA: Diagnosis not present

## 2017-06-21 DIAGNOSIS — Z79811 Long term (current) use of aromatase inhibitors: Secondary | ICD-10-CM | POA: Diagnosis not present

## 2017-06-21 DIAGNOSIS — C50219 Malignant neoplasm of upper-inner quadrant of unspecified female breast: Secondary | ICD-10-CM

## 2017-06-21 DIAGNOSIS — Z170 Estrogen receptor positive status [ER+]: Secondary | ICD-10-CM | POA: Diagnosis present

## 2017-06-21 DIAGNOSIS — C50212 Malignant neoplasm of upper-inner quadrant of left female breast: Secondary | ICD-10-CM | POA: Diagnosis present

## 2017-06-21 LAB — CBC WITH DIFFERENTIAL/PLATELET
Basophils Absolute: 0 10*3/uL (ref 0.0–0.1)
Basophils Relative: 1 %
Eosinophils Absolute: 0.2 10*3/uL (ref 0.0–0.7)
Eosinophils Relative: 3 %
HCT: 36.8 % (ref 36.0–46.0)
HEMOGLOBIN: 12 g/dL (ref 12.0–15.0)
LYMPHS ABS: 2.7 10*3/uL (ref 0.7–4.0)
LYMPHS PCT: 43 %
MCH: 27.6 pg (ref 26.0–34.0)
MCHC: 32.6 g/dL (ref 30.0–36.0)
MCV: 84.6 fL (ref 78.0–100.0)
MONOS PCT: 6 %
Monocytes Absolute: 0.4 10*3/uL (ref 0.1–1.0)
NEUTROS PCT: 47 %
Neutro Abs: 3 10*3/uL (ref 1.7–7.7)
Platelets: 183 10*3/uL (ref 150–400)
RBC: 4.35 MIL/uL (ref 3.87–5.11)
RDW: 14 % (ref 11.5–15.5)
WBC: 6.4 10*3/uL (ref 4.0–10.5)

## 2017-06-21 LAB — COMPREHENSIVE METABOLIC PANEL
ALK PHOS: 96 U/L (ref 38–126)
ALT: 16 U/L (ref 14–54)
AST: 20 U/L (ref 15–41)
Albumin: 3.7 g/dL (ref 3.5–5.0)
Anion gap: 10 (ref 5–15)
BILIRUBIN TOTAL: 0.5 mg/dL (ref 0.3–1.2)
BUN: 14 mg/dL (ref 6–20)
CALCIUM: 9 mg/dL (ref 8.9–10.3)
CO2: 23 mmol/L (ref 22–32)
Chloride: 107 mmol/L (ref 101–111)
Creatinine, Ser: 0.89 mg/dL (ref 0.44–1.00)
Glucose, Bld: 137 mg/dL — ABNORMAL HIGH (ref 65–99)
Potassium: 3.6 mmol/L (ref 3.5–5.1)
Sodium: 140 mmol/L (ref 135–145)
TOTAL PROTEIN: 7.3 g/dL (ref 6.5–8.1)

## 2017-06-28 ENCOUNTER — Other Ambulatory Visit (HOSPITAL_COMMUNITY): Payer: Self-pay | Admitting: Adult Health

## 2017-06-28 ENCOUNTER — Encounter (HOSPITAL_COMMUNITY): Payer: Self-pay | Admitting: Adult Health

## 2017-06-28 ENCOUNTER — Ambulatory Visit (HOSPITAL_COMMUNITY): Payer: BLUE CROSS/BLUE SHIELD | Admitting: Adult Health

## 2017-06-28 ENCOUNTER — Inpatient Hospital Stay (HOSPITAL_COMMUNITY): Payer: BLUE CROSS/BLUE SHIELD | Attending: Hematology | Admitting: Adult Health

## 2017-06-28 VITALS — BP 154/85 | HR 90 | Temp 97.6°F | Resp 18 | Wt 160.0 lb

## 2017-06-28 DIAGNOSIS — Z780 Asymptomatic menopausal state: Secondary | ICD-10-CM

## 2017-06-28 DIAGNOSIS — G4700 Insomnia, unspecified: Secondary | ICD-10-CM

## 2017-06-28 DIAGNOSIS — Z79899 Other long term (current) drug therapy: Secondary | ICD-10-CM

## 2017-06-28 DIAGNOSIS — N951 Menopausal and female climacteric states: Secondary | ICD-10-CM | POA: Insufficient documentation

## 2017-06-28 DIAGNOSIS — Z171 Estrogen receptor negative status [ER-]: Secondary | ICD-10-CM

## 2017-06-28 DIAGNOSIS — Z170 Estrogen receptor positive status [ER+]: Secondary | ICD-10-CM | POA: Diagnosis not present

## 2017-06-28 DIAGNOSIS — Z9221 Personal history of antineoplastic chemotherapy: Secondary | ICD-10-CM | POA: Diagnosis not present

## 2017-06-28 DIAGNOSIS — R232 Flushing: Secondary | ICD-10-CM

## 2017-06-28 DIAGNOSIS — C50212 Malignant neoplasm of upper-inner quadrant of left female breast: Secondary | ICD-10-CM | POA: Insufficient documentation

## 2017-06-28 DIAGNOSIS — Z79811 Long term (current) use of aromatase inhibitors: Secondary | ICD-10-CM | POA: Diagnosis not present

## 2017-06-28 DIAGNOSIS — Z923 Personal history of irradiation: Secondary | ICD-10-CM

## 2017-06-28 DIAGNOSIS — C50912 Malignant neoplasm of unspecified site of left female breast: Secondary | ICD-10-CM

## 2017-06-28 MED ORDER — VENLAFAXINE HCL ER 75 MG PO CP24: 75.0000 mg | ORAL_CAPSULE | Freq: Every day | ORAL | 2 refills | Status: DC

## 2017-06-28 MED ORDER — ZOLPIDEM TARTRATE 10 MG PO TABS: 5.0000 mg | ORAL_TABLET | Freq: Every evening | ORAL | 1 refills | Status: AC | PRN

## 2017-06-28 MED ORDER — CALCIUM CARBONATE-VITAMIN D 500-200 MG-UNIT PO TABS: 2.0000 | ORAL_TABLET | Freq: Every day | ORAL | 6 refills | Status: DC

## 2017-06-28 NOTE — Patient Instructions (Addendum)
[place] at [facility]  Discharge Instructions:  You were seen by Mike Craze, NP, today.  Take calcium 1200mg  plus Vitamin D 1,000 or 2,000 units daily. Mammogram in  May Bone Density in November Increase your effexor to 75mg  daily Celexa 10mg - 1/2 celexa (5mg ) for 1 week, then Celexa 5mg  every other day for one week then stop the medication.  _______________________________________________________________  Thank you for choosing [place] at [facility] to provide your oncology and hematology care.  To afford each patient quality time with our providers, please arrive at least 15 minutes before your scheduled appointment.  You need to re-schedule your appointment if you arrive 10 or more minutes late.  We strive to give you quality time with our providers, and arriving late affects you and other patients whose appointments are after yours.  Also, if you no show three or more times for appointments you may be dismissed from the clinic.  Again, thank you for choosing [place] at [place] hope is that these requests will allow you access to exceptional care and in a timely manner. _______________________________________________________________  If you have questions after your visit, please contact our office at [phone] between the hours of 8:30 a.m. and 5:00 p.m. Voicemails left after 4:30 p.m. will not be returned until the following business day. _______________________________________________________________  For prescription refill requests, have your pharmacy contact our office. _______________________________________________________________  Recommendations made by the consultant and any test results will be sent to your referring physician. _______________________________________________________________

## 2017-06-28 NOTE — Progress Notes (Addendum)
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      08/09/2013 Procedure    US- guided biopsy of left breast mass.      08/09/2013 Pathology Results    Invasive ductal carcinoma      08/20/2013 Procedure    Left lumpectomy and sentinel lymph node biopsy and modified axillary dissection by Dr. Anthony Sar.      08/20/2013 Pathology Results    Invasive tumor is 5 cm in largest dimension, grade 3, negative margins, 1/10 lymph node positive for micrometastasis and 1/10 lymph node positive for macro-metastasis, + LVI.  ER + > 90%, PR + > 90%, Ki-67 23%, HER2+ but ratio was 1.26.      09/27/2013 - 11/08/2013 Chemotherapy    AC x 4 cycles       12/06/2013 - 02/21/2014 Chemotherapy    Weekly Taxol x 12 cycles      04/02/2014 - 05/21/2014 Radiation Therapy    Dr. Pablo Ledger      05/27/2014 - 01/05/2016 Anti-estrogen oral therapy    Tamoxifen 20 mg daily.      01/05/2016 Adverse Reaction    Severe hot flashes.  She is 62 years old as of 01/04/2016.  She should be post-menopausal.      01/05/2016 -  Anti-estrogen oral therapy    Arimidex      02/02/2016 Imaging    Bone density- BMD as determined from Femur Neck Left is 0.927 g/cm2 with a T-Score of -0.8. This patient is considered normal according to [facility]) criteria.         INTERVAL HISTORY:  [name] 62 y.o. female returns  for routine follow-up for left breast cancer.   Here today unaccompanied.   Overall, she tells me she has been feeling very well.  Appetite and energy levels both 100%.  She denies any pain.  She reports continued issues with hot flashes; she does not feel like the Effexor XR is helping much.  She is currently only taking 1 capsule (37.5 mg) once daily.  Remains on Arimidex daily.  Aside from hot flashes, she is tolerating the Arimidex "pretty well."  Denies any focal bone pain, arthralgias, vaginal bleeding, or vaginal dryness.  She sometimes struggles with sleeping well, which she attributes to the Arimidex.  She tells me she was given a prescription for "a low-dose sleep medicine"; she tells me her PCP did not feel comfortable refilling this prescription, and she is asking for our help in doing so.  She is not sure of the medication name, but does have the prescription bottle at home.  She does not tolerate Tums well for her calcium supplementation; she is asking for our recommendations for calcium and vitamin D supplements.  She tries to remain as active as she is able.  She tells me that her PCP is leaving the current practice; she is asking for recommendations for  a new PCP.  Otherwise, she is largely without complaints today.     REVIEW OF SYSTEMS:  Review of Systems - Oncology Per HPI.  Otherwise 12 point ROS completed and negative except as stated above.   PAST MEDICAL/SURGICAL HISTORY:  Past Medical History:  Diagnosis Date  . Breast cancer, left ([place]) 01/05/2016   breast  . Diabetes mellitus without complication ([place])   . Hyperlipidemia   . Hypertension 2015   Past Surgical History:  Procedure Laterality Date  . BREAST BIOPSY Right 1998  . TUBAL LIGATION       SOCIAL HISTORY:  Social History   Socioeconomic History  . Marital status: Divorced    Spouse name: Not on file  . Number of children: 2  . Years of education: ged  . Highest education level: Not on file   Occupational History  . Occupation: dietician aid    Comment: [employer]  . Financial resource strain: Not on file  . Food insecurity:    Worry: Not on file    Inability: Not on file  . Transportation needs:    Medical: Not on file    Non-medical: Not on file  Tobacco Use  . Smoking status: Never Smoker  . Smokeless tobacco: Never Used  Substance and Sexual Activity  . Alcohol use: No  . Drug use: No  . Sexual activity: Yes    Birth control/protection: Post-menopausal  Lifestyle  . Physical activity:    Days per week: Not on file    Minutes per session: Not on file  . Stress: Not on file  Relationships  . Social connections:    Talks on phone: Not on file    Gets together: Not on file    Attends religious service: Not on file    Active member of club or organization: Not on file    Attends meetings of clubs or organizations: Not on file    Relationship status: Not on file  . Intimate partner violence:    Fear of current or ex partner: Not on file    Emotionally abused: Not on file    Physically abused: Not on file    Forced sexual activity: Not on file  Other Topics Concern  . Not on file  Social History Narrative   Lives alone   Two daughters are grown   Sometimes stays with sister [place]    FAMILY HISTORY:  Family History  Problem Relation Age of Onset  . Cancer Mother        breast  . COPD Father   . Emphysema Father   . Hypertension Sister   . Hyperlipidemia Sister   . Cancer Brother        lung  . Pulmonary embolism Maternal Aunt   . Diabetes Paternal Aunt   . Stroke Maternal Grandmother   . Stroke Maternal Grandfather   . Aneurysm Paternal Grandmother   . Stroke Paternal Grandfather   . Early death Brother 54       murder  . Hypertension Daughter   . Edema Daughter     CURRENT MEDICATIONS:  Outpatient Encounter Medications as of 06/28/2017  Medication Sig  . [DISCONTINUED] venlafaxine (EFFEXOR) 37.5 MG tablet Take  37.5 mg by mouth 2 (two) times daily.  Marland Kitchen anastrozole (ARIMIDEX) 1 MG tablet Take 1 tablet (1 mg total) by mouth daily.  Marland Kitchen atorvastatin (LIPITOR) 20 MG tablet Take 1 tablet (20 mg total) by mouth daily.  . calcium-vitamin D (  OSCAL WITH D) 500-200 MG-UNIT tablet Take 2 tablets by mouth daily with breakfast.  . Cholecalciferol (D3 DOTS) 2000 units TBDP Take 1 capsule by mouth daily.  Marland Kitchen glimepiride (AMARYL) 1 MG tablet TAKE 1/2 TABLET BY MOUTH ONCE DAILY  . hydrochlorothiazide (MICROZIDE) 12.5 MG capsule Take 1 capsule (12.5 mg total) daily by mouth.  . nystatin (MYCOSTATIN/NYSTOP) powder Apply powder under breasts twice daily  . potassium chloride SA (K-DUR,KLOR-CON) 20 MEQ tablet Take 1 tablet (20 mEq total) by mouth 2 (two) times daily. (Patient taking differently: Take 20 mEq by mouth daily. )  . venlafaxine XR (EFFEXOR-XR) 75 MG 24 hr capsule Take 1 capsule (75 mg total) by mouth daily with breakfast.  . [DISCONTINUED] citalopram (CELEXA) 10 MG tablet Take 1 tablet (10 mg total) by mouth daily.   No facility-administered encounter medications on file as of 06/28/2017.     ALLERGIES:  Allergies  Allergen Reactions  . Effexor [Venlafaxine] Nausea And Vomiting    GI intolerance only  . Hydrocodone Nausea And Vomiting    GI intolerance only     PHYSICAL EXAM:  ECOG Performance status: 0-1 - Mild symptoms; remains independent   Vitals:   06/28/17 1140  BP: (!) 154/85  Pulse: 90  Resp: 18  Temp: 97.6 F (36.4 C)  SpO2: 99%   Filed Weights   06/28/17 1140  Weight: 160 lb (72.6 kg)    Physical Exam  Constitutional: She is oriented to person, place, and time. She appears well-developed and well-nourished.  HENT:  Head: Normocephalic.  Mouth/Throat: Oropharynx is clear and moist. No oropharyngeal exudate.  Eyes: Pupils are equal, round, and reactive to light. Conjunctivae are normal. No scleral icterus.  Neck: Normal range of motion. Neck supple.  Cardiovascular: Normal rate  and regular rhythm.  Pulmonary/Chest: Effort normal and breath sounds normal. No respiratory distress.    Abdominal: Soft. Bowel sounds are normal. There is no tenderness.  Musculoskeletal: Normal range of motion. She exhibits edema (trace BLE/ankle edema).  Lymphadenopathy:    She has no cervical adenopathy.       Right: No supraclavicular adenopathy present.       Left: No supraclavicular adenopathy present.  Neurological: She is alert and oriented to person, place, and time. No cranial nerve deficit.  Skin: Skin is warm and dry. No rash noted.  Psychiatric: She has a normal mood and affect. Her behavior is normal. Judgment and thought content normal.  Nursing note and vitals reviewed.    LABORATORY DATA:  I have reviewed the labs as listed.  CBC    Component Value Date/Time   WBC 6.4 06/21/2017 1230   RBC 4.35 06/21/2017 1230   HGB 12.0 06/21/2017 1230   HCT 36.8 06/21/2017 1230   PLT 183 06/21/2017 1230   MCV 84.6 06/21/2017 1230   MCH 27.6 06/21/2017 1230   MCHC 32.6 06/21/2017 1230   RDW 14.0 06/21/2017 1230   LYMPHSABS 2.7 06/21/2017 1230   MONOABS 0.4 06/21/2017 1230   EOSABS 0.2 06/21/2017 1230   BASOSABS 0.0 06/21/2017 1230   CMP Latest Ref Rng & Units 06/21/2017 12/27/2016 12/10/2016  Glucose 65 - 99 mg/dL 137(H) 109(H) 89  BUN 6 - 20 mg/dL '14 16 18  '$ Creatinine 0.44 - 1.00 mg/dL 0.89 1.03(H) 0.84  Sodium 135 - 145 mmol/L 140 139 141  Potassium 3.5 - 5.1 mmol/L 3.6 3.3(L) 3.5  Chloride 101 - 111 mmol/L 107 104 105  CO2 22 - 32 mmol/L '23 28 28  '$ Calcium  8.9 - 10.3 mg/dL 9.0 9.0 9.6  Total Protein 6.5 - 8.1 g/dL 7.3 7.0 7.1  Total Bilirubin 0.3 - 1.2 mg/dL 0.5 0.8 0.3  Alkaline Phos 38 - 126 U/L 96 94 -  AST 15 - 41 U/L '20 17 14  '$ ALT 14 - 54 U/L '16 15 14    '$ PENDING LABS:    DIAGNOSTIC IMAGING:  *The following radiologic images and reports have been reviewed independently and agree with below findings.  Last mammogram: 08/10/16 CLINICAL DATA:  History of  treated left breast cancer, status post lumpectomy in 2016.  EXAM: 2D DIGITAL DIAGNOSTIC BILATERAL MAMMOGRAM WITH CAD AND ADJUNCT TOMO  COMPARISON:  Previous exam(s).  ACR Breast Density Category b: There are scattered areas of fibroglandular density.  FINDINGS: Mammographically, there are no suspicious masses, areas of nonsurgical architectural distortion or microcalcifications in either breast. There are stable post lumpectomy changes in the left breast upper inner quadrant.  Mammographic images were processed with CAD.  IMPRESSION: No mammographic evidence of malignancy, post left lumpectomy.  RECOMMENDATION: Diagnostic mammogram is suggested in 1 year. (Code:DM-B-01Y)  I have discussed the findings and recommendations with the patient. Results were also provided in writing at the conclusion of the visit. If applicable, a reminder letter will be sent to the patient regarding the next appointment.  BI-RADS CATEGORY  2: Benign.   Electronically Signed   By: Fidela Salisbury M.D.   On: 08/10/2016 14:53    Last DEXA scan: 02/02/16 EXAM: DUAL X-RAY ABSORPTIOMETRY (DXA) FOR BONE MINERAL DENSITY  IMPRESSION: Ordering Physician:  Dr. Baird Cancer,  Your patient Amelie Caracci completed a BMD test on 02/02/2016 using the [place] (software version: 14.10) manufactured by [employer]. The following summarizes the results of our evaluation. PATIENT BIOGRAPHICAL: Name: ROSSI, SILVESTRO Patient ID: [id number] Birth Date: [birth year] Height: 62.5 in. Gender: Female Exam Date: 02/02/2016 Weight: 160.0 lbs. Indications: Height Loss, Hx Breast Ca, Post Menopausal Fractures: Treatments: Anastrozole, Vitamin D DENSITOMETRY RESULTS: Site      Region    Measured Date Measured Age WHO Classification Young Adult T-score BMD         %Change vs. Previous Significant Change (*) AP Spine L1-L4 02/02/2016 60.0 Normal 0.1 1.195 g/cm2  - -  DualFemur Neck Left 02/02/2016 60.0 Normal -0.8 0.927 g/cm2 - - ASSESSMENT: BMD as determined from Femur Neck Left is 0.927 g/cm2 with a T-Score of -0.8. This patient is considered normal according to [facility]) criteria.     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  Treated at [facility] in [place]. Initially treated with (L) lumpectomy by Dr. Anthony Sar on 08/20/13. Received adjuvant chemotherapy with Adriamycin/Cytoxan x 4 cycles, followed by weekly Taxol x 12. Completed chemotherapy on 02/21/14.  Went on to complete adjuvant breast radiation therapy on 05/21/14. Initially started on anti-estrogen treatment with Tamoxifen in 05/2014. Remained on Tamoxifen until she transferred her care to [facility] in 12/2015, when her anti-estrogen therapy was transitioned to Arimidex daily in 12/2015.   -Remains on Arimidex daily.  Hot flashes are her biggest complaint (see below).  -Plan to continue anti-estrogen therapy for total of 5-10 years.  We have learned that some women may benefit from the extension of anti-estrogen therapy for 10 years instead of 5 years.  We discussed that the Breast Cancer Index (  BCI) genomic testing helps both patients and providers decide which patients may gain the most benefit from extended anti-estrogen use.  I explained that the BCI would help [place] better understand, based on her cancer's biology, if she is 1) at high or low risk for recurrence & 2) whether there is high or low likelihood of benefit from extension of therapy beyond 5 years.  She agreed to proceed with BCI testing and orders placed today.  She understands that it may take several weeks to get the results of the BCI testing; we will review results with her either via phone or at her next follow-up visit here at the cancer center.   -Last mammogram done on  08/10/16 and negative. Next annual diagnostic mammogram will be due in 07/2017; orders placed today.  -Clinical breast exam performed today and benign.  -Labs reviewed with patient and are stable.  Discussed with her that there is no oncologic reason for labs at her next visit, since her PCP collects labs periodically and there are no reliable tumor marker blood tests for early stage breast cancer.  -Return to cancer center in ~7 months for follow-up (after next DEXA scan); no need for labs at that time.    Hot flashes secondary to menopause and anti-estrogen therapy:  -Currently taking Effexor XR 37.5 mg. She is also taking low-dose Celexa 10 mg daily.  Discussed with her the option of weaning off of SSRI (Celexa) and optimizing her Effexor dose by increasing to 75 mg daily. She agreed.  Provided written instructions to her today at conclusion of visit to take 1/2 pill Celexa (5 mg) x 1 week, then 5 mg every other day x 1 week, then stop. I do not suspect she will have serotonin withdrawal symptoms, but will be conservative with weaning instructions nonetheless.   -New prescription for Effexor XR 75 mg once daily e-scribed to pt's pharmacy.  Gave her instructions on how to take.  Encouraged her to call us if she does not have improved benefit in her hot flashes and/or she does not have optimal mood stability with this change in medication.  She agreed with the plan.    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 However, patient is not sure if this was the medication that was most helpful to her in the past.  Recommended she look at her medicine bottles when she gets home and calls [place] to let us know what medication she is requesting a refill for.  She understands that while I feel comfortable refilling her medications to help her sleep, there may be other providers who do not feel comfortable continuing these medications.  She agreed.    Addendum:       *Patient called clinic to report that she was taking Ambien 10 mg QHSprn.  [place] Controlled Substance Reporting System reviewed. Therefore, I have e-scribed Ambien 5-10 mg QHSprn, #30, 1 refill to her pharmacy ([facility]).  Will ask nursing to give her a call to make her aware that this prescription was sent.      Health maintenance/Wellness promotion:  -Her current PCP, Dr. Meda Coffee, is leaving her current practice. Ms. Landers is therefore looking for a new PCP. I provided her with a list of local PCPs and their contact information today. Encouraged her to do her research and call different practices to see who is accepting new patients.      Bone health:  -  Last DEXA scan on 02/02/16 showed normal bone density with T-score -0.8.   -Reiterated that aromatase inhibitor therapy can decrease bone density over time. Recommended repeat DEXA imaging in 01/2018; she agreed. Orders placed today.    -Recommended calcium/vitamin D supplementation, as well as increase weight-bearing exercises as tolerated.  E-scribed calcium carbonate/vitamin D 500/200 mg-unit 2 tabs once daily to her pharmacy. She understands that this medication is OTC and her insurance may not cover it.  If she needs to buy the calcium and vitamin D separately, encouraged her to look for supplements that have ~1200 mg calcium and ~1000-2000 units vitamin D per day (if taken separately).        Dispo:  -BCI ordered today (message sent to [employer], RN-Nurse Navigator for help with ordering BCI testing) -Mammogram in 07/2017; orders placed today.  -DEXA scan due in 01/2018; orders placed today.  -Return to cancer center in ~7 months (after DEXA scan) for follow-up; no labs needed.    All questions were answered to patient's stated satisfaction. Encouraged patient to call with any new concerns or questions before her next visit to the cancer center and we can certain see her sooner, if needed.       Orders placed this encounter:  Orders Placed This Encounter  Procedures  . DG Bone Density  . MM DIAG BREAST TOMO BILATERAL      Mike Craze, NP [place] [phone]

## 2017-06-29 ENCOUNTER — Encounter (HOSPITAL_COMMUNITY): Payer: Self-pay | Admitting: Emergency Medicine

## 2017-06-29 NOTE — Progress Notes (Signed)
BCI sent to [facility].  Fax confirmed.

## 2017-07-25 ENCOUNTER — Ambulatory Visit: Payer: BLUE CROSS/BLUE SHIELD | Admitting: Family Medicine

## 2017-07-27 ENCOUNTER — Other Ambulatory Visit (HOSPITAL_COMMUNITY): Payer: Self-pay | Admitting: Adult Health

## 2017-07-27 DIAGNOSIS — Z170 Estrogen receptor positive status [ER+]: Principal | ICD-10-CM

## 2017-07-27 DIAGNOSIS — C50212 Malignant neoplasm of upper-inner quadrant of left female breast: Secondary | ICD-10-CM

## 2017-08-09 ENCOUNTER — Ambulatory Visit: Payer: BLUE CROSS/BLUE SHIELD | Admitting: Family Medicine

## 2017-08-10 ENCOUNTER — Other Ambulatory Visit: Payer: Self-pay | Admitting: Adult Health

## 2017-08-10 DIAGNOSIS — R928 Other abnormal and inconclusive findings on diagnostic imaging of breast: Secondary | ICD-10-CM

## 2017-08-16 ENCOUNTER — Ambulatory Visit (HOSPITAL_COMMUNITY)
Admission: RE | Admit: 2017-08-16 | Discharge: 2017-08-16 | Disposition: A | Discharge status: Home / Self Care | Payer: BLUE CROSS/BLUE SHIELD | Source: Ambulatory Visit | Attending: Adult Health | Admitting: Adult Health

## 2017-08-16 ENCOUNTER — Encounter (HOSPITAL_COMMUNITY): Payer: Self-pay

## 2017-08-16 DIAGNOSIS — C50912 Malignant neoplasm of unspecified site of left female breast: Secondary | ICD-10-CM | POA: Insufficient documentation

## 2017-08-16 DIAGNOSIS — Z171 Estrogen receptor negative status [ER-]: Secondary | ICD-10-CM | POA: Insufficient documentation

## 2017-08-18 ENCOUNTER — Encounter: Payer: Self-pay | Admitting: Family Medicine

## 2017-08-19 ENCOUNTER — Encounter: Payer: Self-pay | Admitting: Family Medicine

## 2017-09-16 ENCOUNTER — Ambulatory Visit: Payer: BLUE CROSS/BLUE SHIELD | Admitting: Family Medicine

## 2017-10-21 ENCOUNTER — Ambulatory Visit: Payer: BLUE CROSS/BLUE SHIELD | Admitting: Family Medicine

## 2017-11-07 ENCOUNTER — Telehealth (HOSPITAL_COMMUNITY): Payer: Self-pay | Admitting: *Deleted

## 2017-11-10 ENCOUNTER — Other Ambulatory Visit (HOSPITAL_COMMUNITY): Payer: Self-pay | Admitting: *Deleted

## 2017-11-10 NOTE — Telephone Encounter (Signed)
Medication was discontinued on 06/28/17 by Mike Craze NP.

## 2018-02-06 ENCOUNTER — Other Ambulatory Visit (HOSPITAL_COMMUNITY): Payer: BLUE CROSS/BLUE SHIELD

## 2018-02-06 ENCOUNTER — Ambulatory Visit (HOSPITAL_COMMUNITY): Payer: BLUE CROSS/BLUE SHIELD | Admitting: Hematology

## 2018-02-08 ENCOUNTER — Other Ambulatory Visit: Payer: Self-pay

## 2018-02-08 ENCOUNTER — Inpatient Hospital Stay (HOSPITAL_COMMUNITY): Payer: BLUE CROSS/BLUE SHIELD | Attending: Hematology | Admitting: Internal Medicine

## 2018-02-08 ENCOUNTER — Encounter (HOSPITAL_COMMUNITY): Payer: Self-pay | Admitting: Internal Medicine

## 2018-02-08 ENCOUNTER — Ambulatory Visit (HOSPITAL_COMMUNITY): Payer: BLUE CROSS/BLUE SHIELD | Admitting: Internal Medicine

## 2018-02-08 VITALS — BP 156/87 | HR 84 | Temp 98.5°F | Resp 16 | Wt 159.3 lb

## 2018-02-08 DIAGNOSIS — C50212 Malignant neoplasm of upper-inner quadrant of left female breast: Secondary | ICD-10-CM | POA: Diagnosis not present

## 2018-02-08 DIAGNOSIS — Z923 Personal history of irradiation: Secondary | ICD-10-CM | POA: Insufficient documentation

## 2018-02-08 DIAGNOSIS — Z79811 Long term (current) use of aromatase inhibitors: Secondary | ICD-10-CM

## 2018-02-08 DIAGNOSIS — Z9221 Personal history of antineoplastic chemotherapy: Secondary | ICD-10-CM | POA: Diagnosis not present

## 2018-02-08 DIAGNOSIS — Z780 Asymptomatic menopausal state: Secondary | ICD-10-CM

## 2018-02-08 DIAGNOSIS — Z170 Estrogen receptor positive status [ER+]: Secondary | ICD-10-CM

## 2018-02-08 DIAGNOSIS — Z23 Encounter for immunization: Secondary | ICD-10-CM | POA: Diagnosis not present

## 2018-02-08 DIAGNOSIS — Z0000 Encounter for general adult medical examination without abnormal findings: Secondary | ICD-10-CM

## 2018-02-08 MED ORDER — ANASTROZOLE 1 MG PO TABS: 1.0000 mg | ORAL_TABLET | Freq: Every day | ORAL | 4 refills | Status: DC

## 2018-02-08 MED ORDER — CITALOPRAM HYDROBROMIDE 10 MG PO TABS: 10.0000 mg | ORAL_TABLET | Freq: Every day | ORAL | 1 refills | Status: DC

## 2018-02-08 MED ORDER — INFLUENZA VAC SPLIT QUAD 0.5 ML IM SUSY
0.5000 mL | PREFILLED_SYRINGE | Freq: Once | INTRAMUSCULAR | Status: AC
  Administered 2018-02-08: 0.5 mL via INTRAMUSCULAR

## 2018-02-08 NOTE — Progress Notes (Signed)
Diagnosis Healthcare maintenance - Plan: Influenza vac split quadrivalent PF (FLUARIX) injection 0.5 mL  Malignant neoplasm of upper-inner quadrant of left breast in female, estrogen receptor positive ([place]) - Plan: CBC with Differential/Platelet, Comprehensive metabolic panel, Lactate dehydrogenase, MM Digital Diagnostic Bilat, DG Bone Density, anastrozole (ARIMIDEX) 1 MG tablet  Post-menopausal - Plan: CBC with Differential/Platelet, Comprehensive metabolic panel, Lactate dehydrogenase, MM Digital Diagnostic Bilat, DG Bone Density  Staging Cancer Staging Malignant neoplasm of left female breast [facility]) Staging form: Breast, AJCC 7th Edition - Clinical stage from 07/30/2013: Stage IIB (T2, N1, M0) - Signed by Baird Cancer, PA-C on 01/05/2016   Assessment and Plan:  1.  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  Treated at [facility] in [place]. Initially treated with (L) lumpectomy by Dr. Anthony Sar on 08/20/13. Received adjuvant chemotherapy with Adriamycin/Cytoxan x 4 cycles, followed by weekly Taxol x 12. Completed chemotherapy on 02/21/14.  Went on to complete adjuvant breast radiation therapy on 05/21/14. Initially started on anti-estrogen treatment with Tamoxifen in 05/2014. Remained on Tamoxifen until she transferred her care to [facility] in 12/2015, when her anti-estrogen therapy was transitioned to Arimidex daily in 12/2015.   -Remains on Arimidex daily.  Hot flashes are her biggest complaint.    Pt had BCI testing done 07/19/2017 that showed a high likelihood of benefit of extended endocrine therapy.  Pt is advised to continue Arimidex for 10 years.  If she continues to have worsening hot flashes, options are to switch to Femara and determine if tolerability improves.  Pt had bilateral diagnostic mammogram done on 08/16/2017 that was negative.  She is set up for bilateral diagnostic mammogram in  08/2018 and will RTC for follow-up at that time to go over results.  Rx for Arimidex # 90 with 4 refills sent to pharmacy.    2.  Hot flashes secondary to menopause and anti-estrogen therapy.  Pt is on Effexor XR 75 mg daily.  She reports Effexor is not working.  She has been treated with Celexa in the past and is requesting Celexa.  Rx for Celexa 10 mg po daily # 30 with 1 refill sent to pharmacy.  Pt should stop Effexor once Celexa begins.   If symptoms worsen despite Celexa, pt will have option of switching to Femara to determine if change in AI therapy improves symptoms.    3.  HTN.  BP is 156/87.  Follow-up with PCP.    4.  Health maintenance.  Bone density due in 01/2018.  Ordered today and pt will be notified of results. Pt has never had colonoscopy and is referred to GI for evaluation.  Pt requesting flu vaccine which is administered today.    25 minutes spent with more than 50% spent in counseling and coordination of care.    Interval History: Historical data obtained from note dated 06/28/2017.  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  Treated at [facility] in [place]. Initially treated with (L) lumpectomy by Dr. Anthony Sar on 08/20/13. Received adjuvant chemotherapy with Adriamycin/Cytoxan x 4 cycles, followed by weekly Taxol x 12. Completed chemotherapy on 02/21/14.  Went on to complete adjuvant breast radiation therapy on 05/21/14. Initially started on anti-estrogen treatment with Tamoxifen in 05/2014. Remained on Tamoxifen until she transferred her care to [facility] in 12/2015, when her anti-estrogen therapy was transitioned to Arimidex daily  in 12/2015.   -Remains on Arimidex daily.  Hot flashes are her biggest complaint.    Pt had BCI testing done 07/19/2017 that showed a high likelihood of benefit of extended endocrine therapy.  Pt is advised to continue Arimidex for 10 years.   Current Status:  Pt  is seen today for follow-up.  She has never had colonoscopy.  She reports Effexor is not working and desires to go back on Celexa.      Malignant neoplasm of left female breast ([place])   07/30/2013 Mammogram    Mammogram- 5 cm from nipple, 1.6 x 1.2 x 1.0 cm spiculated, irregular mass in left breast at 11 o'clock position.    08/09/2013 Procedure    US- guided biopsy of left breast mass.    08/09/2013 Pathology Results    Invasive ductal carcinoma    08/20/2013 Procedure    Left lumpectomy and sentinel lymph node biopsy and modified axillary dissection by Dr. Anthony Sar.    08/20/2013 Pathology Results    Invasive tumor is 5 cm in largest dimension, grade 3, negative margins, 1/10 lymph node positive for micrometastasis and 1/10 lymph node positive for macro-metastasis, + LVI.  ER + > 90%, PR + > 90%, Ki-67 23%, HER2+ but ratio was 1.26.    09/27/2013 - 11/08/2013 Chemotherapy    AC x 4 cycles     12/06/2013 - 02/21/2014 Chemotherapy    Weekly Taxol x 12 cycles    04/02/2014 - 05/21/2014 Radiation Therapy    Dr. Pablo Ledger    05/27/2014 - 01/05/2016 Anti-estrogen oral therapy    Tamoxifen 20 mg daily.    01/05/2016 Adverse Reaction    Severe hot flashes.  She is 62 years old as of 01/04/2016.  She should be post-menopausal.    01/05/2016 -  Anti-estrogen oral therapy    Arimidex    02/02/2016 Imaging    Bone density- BMD as determined from Femur Neck Left is 0.927 g/cm2 with a T-Score of -0.8. This patient is considered normal according to [facility]) criteria.      Problem List Patient Active Problem List   Diagnosis Date Noted  . Situational mixed anxiety and depressive disorder [F43.23] 12/10/2016  . Pedal edema [R60.0] 12/10/2016  . Varicose veins of both lower extremities [I83.93] 12/10/2016  . HLD (hyperlipidemia) [E78.5] 09/28/2016  . Hypertension [I10] 03/19/2016  . Malignant neoplasm of left female breast ([place]) [C50.912] 01/05/2016  . Port-A-Cath in  place [facility] 04/04/2014  . Diabetes mellitus without complication ([place]) [O67.6] 11/09/2013    Past Medical History Past Medical History:  Diagnosis Date  . Breast cancer, left ([place]) 01/05/2016   breast  . Diabetes mellitus without complication ([place])   . Hyperlipidemia   . Hypertension 2015    Past Surgical History Past Surgical History:  Procedure Laterality Date  . BREAST BIOPSY Right 1998  . TUBAL LIGATION      Family History Family History  Problem Relation Age of Onset  . Cancer Mother        breast  . COPD Father   . Emphysema Father   . Hypertension Sister   . Hyperlipidemia Sister   . Cancer Brother        lung  . Pulmonary embolism Maternal Aunt   . Diabetes Paternal Aunt   . Stroke Maternal Grandmother   . Stroke Maternal Grandfather   . Aneurysm Paternal Grandmother   . Stroke Paternal Grandfather   . Early death Brother 42  murder  . Hypertension Daughter   . Edema Daughter      Social History  reports that she has never smoked. She has never used smokeless tobacco. She reports that she does not drink alcohol or use drugs.  Medications  Current Outpatient Medications:  .  anastrozole (ARIMIDEX) 1 MG tablet, Take 1 tablet (1 mg total) by mouth daily., Disp: 90 tablet, Rfl: 4 .  atorvastatin (LIPITOR) 20 MG tablet, Take 1 tablet (20 mg total) by mouth daily., Disp: 90 tablet, Rfl: 3 .  calcium-vitamin D (OSCAL WITH D) 500-200 MG-UNIT tablet, Take 2 tablets by mouth daily with breakfast., Disp: 60 tablet, Rfl: 6 .  Cholecalciferol (D3 DOTS) 2000 units TBDP, Take 1 capsule by mouth daily., Disp: 30 tablet, Rfl: 0 .  glimepiride (AMARYL) 1 MG tablet, TAKE 1/2 TABLET BY MOUTH ONCE DAILY, Disp: 45 tablet, Rfl: 3 .  hydrochlorothiazide (HYDRODIURIL) 25 MG tablet, Take 25 mg by mouth daily. , Disp: , Rfl:  .  hydrochlorothiazide (MICROZIDE) 12.5 MG capsule, Take 1 capsule (12.5 mg total) daily by mouth., Disp: 90 capsule, Rfl: 3 .  nystatin  (MYCOSTATIN/NYSTOP) powder, Apply powder under breasts twice daily, Disp: 30 g, Rfl: 2 .  potassium chloride SA (K-DUR,KLOR-CON) 20 MEQ tablet, Take 1 tablet (20 mEq total) by mouth 2 (two) times daily. (Patient taking differently: Take 20 mEq by mouth daily. ), Disp: 180 tablet, Rfl: 1 .  venlafaxine XR (EFFEXOR-XR) 75 MG 24 hr capsule, Take 1 capsule (75 mg total) by mouth daily with breakfast., Disp: 90 capsule, Rfl: 2 .  zolpidem (AMBIEN) 10 MG tablet, Take 0.5-1 tablets (5-10 mg total) by mouth at bedtime as needed for sleep., Disp: 30 tablet, Rfl: 1  Allergies Effexor [venlafaxine] and Hydrocodone  Review of Systems Review of Systems - Oncology ROS negative other than hot flashes   Physical Exam  Vitals Wt Readings from Last 3 Encounters:  02/08/18 159 lb 4.8 oz (72.3 kg)  06/28/17 160 lb (72.6 kg)  01/24/17 162 lb 0.6 oz (73.5 kg)   Temp Readings from Last 3 Encounters:  02/08/18 98.5 F (36.9 C) (Oral)  06/28/17 97.6 F (36.4 C) (Oral)  01/24/17 97.9 F (36.6 C) (Temporal)   BP Readings from Last 3 Encounters:  02/08/18 (!) 156/87  06/28/17 (!) 154/85  01/24/17 136/86   Pulse Readings from Last 3 Encounters:  02/08/18 84  06/28/17 90  01/24/17 76   Constitutional: Well-developed, well-nourished, and in no distress.   HENT: Head: Normocephalic and atraumatic.  Mouth/Throat: No oropharyngeal exudate. Mucosa moist. Eyes: Pupils are equal, round, and reactive to light. Conjunctivae are normal. No scleral icterus.  Neck: Normal range of motion. Neck supple. No JVD present.  Cardiovascular: Normal rate, regular rhythm and normal heart sounds.  Exam reveals no gallop and no friction rub.   No murmur heard. Pulmonary/Chest: Effort normal and breath sounds normal. No respiratory distress. No wheezes.No rales.  Abdominal: Soft. Bowel sounds are normal. No distension. There is no tenderness. There is no guarding.  Musculoskeletal: No edema or tenderness.   Lymphadenopathy: No cervical, axillary or supraclavicular adenopathy.  Neurological: Alert and oriented to person, place, and time. No cranial nerve deficit.  Skin: Skin is warm and dry. No rash noted. No erythema. No pallor.  Psychiatric: Affect and judgment normal.  Breast exam:  Left lumpectomy changes noted.  No palpable dominant masses noted bilaterally.    Labs No visits with results within 3 Day(s) from this visit.  Latest known visit  with results is:  Appointment on 06/21/2017  Component Date Value Ref Range Status  . WBC 06/21/2017 6.4  4.0 - 10.5 K/uL Final  . RBC 06/21/2017 4.35  3.87 - 5.11 MIL/uL Final  . Hemoglobin 06/21/2017 12.0  12.0 - 15.0 g/dL Final  . HCT 06/21/2017 36.8  36.0 - 46.0 % Final  . MCV 06/21/2017 84.6  78.0 - 100.0 fL Final  . MCH 06/21/2017 27.6  26.0 - 34.0 pg Final  . MCHC 06/21/2017 32.6  30.0 - 36.0 g/dL Final  . RDW 06/21/2017 14.0  11.5 - 15.5 % Final  . Platelets 06/21/2017 183  150 - 400 K/uL Final  . Neutrophils Relative % 06/21/2017 47  % Final  . Neutro Abs 06/21/2017 3.0  1.7 - 7.7 K/uL Final  . Lymphocytes Relative 06/21/2017 43  % Final  . Lymphs Abs 06/21/2017 2.7  0.7 - 4.0 K/uL Final  . Monocytes Relative 06/21/2017 6  % Final  . Monocytes Absolute 06/21/2017 0.4  0.1 - 1.0 K/uL Final  . Eosinophils Relative 06/21/2017 3  % Final  . Eosinophils Absolute 06/21/2017 0.2  0.0 - 0.7 K/uL Final  . Basophils Relative 06/21/2017 1  % Final  . Basophils Absolute 06/21/2017 0.0  0.0 - 0.1 K/uL Final   Performed at [facility], [address] [place], [place] [address]  . Sodium 06/21/2017 140  135 - 145 mmol/L Final  . Potassium 06/21/2017 3.6  3.5 - 5.1 mmol/L Final  . Chloride 06/21/2017 107  101 - 111 mmol/L Final  . CO2 06/21/2017 23  22 - 32 mmol/L Final  . Glucose, Bld 06/21/2017 137* 65 - 99 mg/dL Final  . BUN 06/21/2017 14  6 - 20 mg/dL Final  . Creatinine, Ser 06/21/2017 0.89  0.44 - 1.00 mg/dL Final  . Calcium 06/21/2017 9.0   8.9 - 10.3 mg/dL Final  . Total Protein 06/21/2017 7.3  6.5 - 8.1 g/dL Final  . Albumin 06/21/2017 3.7  3.5 - 5.0 g/dL Final  . AST 06/21/2017 20  15 - 41 U/L Final  . ALT 06/21/2017 16  14 - 54 U/L Final  . Alkaline Phosphatase 06/21/2017 96  38 - 126 U/L Final  . Total Bilirubin 06/21/2017 0.5  0.3 - 1.2 mg/dL Final  . GFR calc non Af Amer 06/21/2017 >60  >60 mL/min Final  . GFR calc Af Amer 06/21/2017 >60  >60 mL/min Final   Comment: (NOTE) The eGFR has been calculated using the CKD EPI equation. This calculation has not been validated in all clinical situations. eGFR's persistently <60 mL/min signify possible Chronic Kidney Disease.   Georgiann Hahn gap 06/21/2017 10  5 - 15 Final   Performed at [facility], [address] [place], [place] [address]     Pathology Orders Placed This Encounter  Procedures  . MM Digital Diagnostic Bilat    Standing Status:   Future    Standing Expiration Date:   02/08/2019    Order Specific Question:   Reason for Exam (SYMPTOM  OR DIAGNOSIS REQUIRED)    Answer:   left breast cancer    Order Specific Question:   Preferred imaging location?    Answer:   [facility]    Standing Status:   Future    Standing Expiration Date:   02/08/2019    Order Specific Question:   Reason for Exam (SYMPTOM  OR DIAGNOSIS REQUIRED)    Answer:   postmenopausal  Order Specific Question:   Preferred imaging location?    Answer:   [facility]  . CBC with Differential/Platelet    Standing Status:   Future    Standing Expiration Date:   02/09/2020  . Comprehensive metabolic panel    Standing Status:   Future    Standing Expiration Date:   02/09/2020  . Lactate dehydrogenase    Standing Status:   Future    Standing Expiration Date:   02/09/2020       Zoila Shutter MD

## 2018-02-08 NOTE — Patient Instructions (Signed)
[place] at [facility] Discharge Instructions  You had a breast cancer index which is a test to determine if you need to stay on your anastrozole for more than 5 years. Your test showed that you would benefit from taking your anastrozole for greater than 5 years.   You need to get a colonoscopy for screening purposes.   Return to see [place] in May for mammogram, blood work, and office visit.   It is also time for you bone density test.   We will refer you to a GI MD for a colonoscopy screening.   Today you got your flu shot.    Thank you for choosing [place] at [facility] to provide your oncology and hematology care.  To afford each patient quality time with our provider, please arrive at least 15 minutes before your scheduled appointment time.   If you have a lab appointment with the [facility] please come in thru the  Main Entrance and check in at the main information desk  You need to re-schedule your appointment should you arrive 10 or more minutes late.  We strive to give you quality time with our providers, and arriving late affects you and other patients whose appointments are after yours.  Also, if you no show three or more times for appointments you may be dismissed from the clinic at the providers discretion.     Again, thank you for choosing [facility].  Our hope is that these requests will decrease the amount of time that you wait before being seen by our physicians.       _____________________________________________________________  Should you have questions after your visit to [facility], please contact our office at [phone] between the hours of 8:00 a.m. and 4:30 p.m.  Voicemails left after 4:00 p.m. will not be returned until the following business day.  For prescription refill requests, have your pharmacy contact our office and allow 72 hours.    Cancer Center Support Programs:   >  Cancer Support Group  2nd Tuesday of the month 1pm-2pm, [facility] Room

## 2018-02-17 ENCOUNTER — Other Ambulatory Visit (HOSPITAL_COMMUNITY): Payer: BLUE CROSS/BLUE SHIELD

## 2018-02-20 ENCOUNTER — Encounter: Payer: Self-pay | Admitting: Gastroenterology

## 2018-02-24 ENCOUNTER — Ambulatory Visit (HOSPITAL_COMMUNITY)
Admission: RE | Admit: 2018-02-24 | Discharge: 2018-02-24 | Disposition: A | Discharge status: Home / Self Care | Payer: BLUE CROSS/BLUE SHIELD | Source: Ambulatory Visit | Attending: Internal Medicine | Admitting: Internal Medicine

## 2018-02-24 DIAGNOSIS — Z170 Estrogen receptor positive status [ER+]: Secondary | ICD-10-CM | POA: Diagnosis present

## 2018-02-24 DIAGNOSIS — Z780 Asymptomatic menopausal state: Secondary | ICD-10-CM | POA: Insufficient documentation

## 2018-02-24 DIAGNOSIS — C50212 Malignant neoplasm of upper-inner quadrant of left female breast: Secondary | ICD-10-CM

## 2018-04-25 ENCOUNTER — Telehealth: Payer: Self-pay | Admitting: *Deleted

## 2018-04-25 ENCOUNTER — Encounter: Payer: Self-pay | Admitting: Gastroenterology

## 2018-04-25 ENCOUNTER — Encounter: Payer: Self-pay | Admitting: *Deleted

## 2018-04-25 ENCOUNTER — Ambulatory Visit: Payer: PRIVATE HEALTH INSURANCE | Admitting: Gastroenterology

## 2018-04-25 ENCOUNTER — Other Ambulatory Visit: Payer: Self-pay | Admitting: *Deleted

## 2018-04-25 VITALS — BP 148/87 | HR 83 | Temp 97.2°F | Ht 63.0 in | Wt 161.6 lb

## 2018-04-25 DIAGNOSIS — Z79899 Other long term (current) drug therapy: Secondary | ICD-10-CM | POA: Diagnosis not present

## 2018-04-25 DIAGNOSIS — Z1211 Encounter for screening for malignant neoplasm of colon: Secondary | ICD-10-CM | POA: Diagnosis not present

## 2018-04-25 MED ORDER — PEG 3350-KCL-NA BICARB-NACL 420 G PO SOLR: 4000.0000 mL | Freq: Once | ORAL | 0 refills | Status: AC

## 2018-04-25 NOTE — Telephone Encounter (Signed)
Called [facility] and spoke with [place]. She advised me no PA is required when done outpatient by in a network provider.

## 2018-04-25 NOTE — Patient Instructions (Signed)
1. Colonoscopy as scheduled. Please see separate instructions. 

## 2018-04-25 NOTE — Assessment & Plan Note (Signed)
Very pleasant 63 year old female with history of breast cancer, presenting for first ever screening colonoscopy.  Plan for conscious sedation, she is on very low-dose antidepressant and uses minimal Ambien (according to controlled substance database she received total of 90 in the past 14 months).  I have discussed the risks, alternatives, benefits with regards to but not limited to the risk of reaction to medication, bleeding, infection, perforation and the patient is agreeable to proceed. Written consent to be obtained.

## 2018-04-25 NOTE — Progress Notes (Signed)
No pcp per patient 

## 2018-04-25 NOTE — Progress Notes (Addendum)
Primary Care Physician:  Patient, No Pcp Per  Primary Gastroenterologist:  Barney Drain, MD  REVIEWED-[facility] TCS AFTER JUN 1 DUE TO COVID 19 RESTRICTIONS. NEEDS PHENERGAN 6.25 MG IV IN PREOP.  Chief Complaint  Patient presents with  . Colonoscopy    never had tcs    HPI:  Laura Moon is a 63 y.o. female here to schedule first ever screening colonoscopy.  She denies any problems with constipation, diarrhea, melena, rectal bleeding.  No abdominal pain.  No upper GI symptoms.  No family history of colon cancer.  She does have a personal history and family history of breast cancer and lung cancer.  Current Outpatient Medications  Medication Sig Dispense Refill  . anastrozole (ARIMIDEX) 1 MG tablet Take 1 tablet (1 mg total) by mouth daily. 90 tablet 4  . atorvastatin (LIPITOR) 20 MG tablet Take 1 tablet (20 mg total) by mouth daily. 90 tablet 3  . calcium-vitamin D (OSCAL WITH D) 500-200 MG-UNIT tablet Take 2 tablets by mouth daily with breakfast. 60 tablet 6  . Cholecalciferol (D3 DOTS) 2000 units TBDP Take 1 capsule by mouth daily. 30 tablet 0  . citalopram (CELEXA) 10 MG tablet Take 1 tablet (10 mg total) by mouth daily. 30 tablet 1  . glimepiride (AMARYL) 1 MG tablet TAKE 1/2 TABLET BY MOUTH ONCE DAILY 45 tablet 3  . hydrochlorothiazide (MICROZIDE) 12.5 MG capsule Take 1 capsule (12.5 mg total) daily by mouth. 90 capsule 3  . nystatin (MYCOSTATIN/NYSTOP) powder Apply powder under breasts twice daily 30 g 2  . potassium chloride SA (K-DUR,KLOR-CON) 20 MEQ tablet Take 1 tablet (20 mEq total) by mouth 2 (two) times daily. (Patient taking differently: Take 20 mEq by mouth daily. ) 180 tablet 1  . zolpidem (AMBIEN) 10 MG tablet Take 0.5-1 tablets (5-10 mg total) by mouth at bedtime as needed for sleep. 30 tablet 1   No current facility-administered medications for this visit.     Allergies as of 04/25/2018 - Review Complete 04/25/2018  Allergen Reaction Noted  . Effexor [venlafaxine]  Nausea And Vomiting 01/05/2016  . Hydrocodone Nausea And Vomiting 06/28/2016    Past Medical History:  Diagnosis Date  . Breast cancer, left ([place]) 01/05/2016   breast  . Diabetes mellitus without complication ([place])   . Hyperlipidemia   . Hypertension 2015    Past Surgical History:  Procedure Laterality Date  . BREAST BIOPSY Right 1998  . BREAST LUMPECTOMY Left 2015   Left lumpectomy and sentinel lymph node biopsy and modified axillary dissection by Dr. Anthony Sar  . PORT-A-CATH REMOVAL    . TUBAL LIGATION      Family History  Problem Relation Age of Onset  . Cancer Mother        breast  . COPD Father   . Emphysema Father   . Hypertension Sister   . Hyperlipidemia Sister   . Cancer Brother        lung  . Pulmonary embolism Maternal Aunt   . Diabetes Paternal Aunt   . Stroke Maternal Grandmother   . Stroke Maternal Grandfather   . Aneurysm Paternal Grandmother   . Stroke Paternal Grandfather   . Early death Brother 69       murder  . Hypertension Daughter   . Edema Daughter   . Colon cancer Neg Hx     Social History   Socioeconomic History  . Marital status: Divorced    Spouse name: Not on file  . Number of children: 2  .  Years of education: ged  . Highest education level: Not on file  Occupational History  . Occupation: dietician aid    Comment: [place]  . Financial resource strain: Not on file  . Food insecurity:    Worry: Not on file    Inability: Not on file  . Transportation needs:    Medical: Not on file    Non-medical: Not on file  Tobacco Use  . Smoking status: Never Smoker  . Smokeless tobacco: Never Used  Substance and Sexual Activity  . Alcohol use: No  . Drug use: No  . Sexual activity: Yes    Birth control/protection: Post-menopausal  Lifestyle  . Physical activity:    Days per week: Not on file    Minutes per session: Not on file  . Stress: Not on file  Relationships  . Social connections:    Talks on  phone: Not on file    Gets together: Not on file    Attends religious service: Not on file    Active member of club or organization: Not on file    Attends meetings of clubs or organizations: Not on file    Relationship status: Not on file  . Intimate partner violence:    Fear of current or ex partner: Not on file    Emotionally abused: Not on file    Physically abused: Not on file    Forced sexual activity: Not on file  Other Topics Concern  . Not on file  Social History Narrative   Lives alone   Two daughters are grown   Sometimes stays with sister Rosa      ROS:  General: Negative for anorexia, weight loss, fever, chills, fatigue, weakness. Eyes: Negative for vision changes.  ENT: Negative for hoarseness, difficulty swallowing , nasal congestion. CV: Negative for chest pain, angina, palpitations, dyspnea on exertion, peripheral edema.  Respiratory: Negative for dyspnea at rest, dyspnea on exertion, cough, sputum, wheezing.  GI: See history of present illness. GU:  Negative for dysuria, hematuria, urinary incontinence, urinary frequency, nocturnal urination.  MS: Negative for joint pain, low back pain.  Derm: Negative for rash or itching.  Neuro: Negative for weakness, abnormal sensation, seizure, frequent headaches, memory loss, confusion.  Psych: Negative for anxiety, depression, suicidal ideation, hallucinations.  Endo: Negative for unusual weight change.  Heme: Negative for bruising or bleeding. Allergy: Negative for rash or hives.    Physical Examination:  BP (!) 148/87   Pulse 83   Temp (!) 97.2 F (36.2 C) (Oral)   Ht 5\' 3"  (1.6 m)   Wt 161 lb 9.6 oz (73.3 kg)   BMI 28.63 kg/m    General: Well-nourished, well-developed in no acute distress.  Head: Normocephalic, atraumatic.   Eyes: Conjunctiva pink, no icterus. Mouth: Oropharyngeal mucosa moist and pink , no lesions erythema or exudate. Neck: Supple without thyromegaly, masses, or lymphadenopathy.   Lungs: Clear to auscultation bilaterally.  Heart: Regular rate and rhythm, no murmurs rubs or gallops.  Abdomen: Bowel sounds are normal, nontender, nondistended, no hepatosplenomegaly or masses, no abdominal bruits or    hernia , no rebound or guarding.   Rectal: deferred Extremities: No lower extremity edema. No clubbing or deformities.  Neuro: Alert and oriented x 4 , grossly normal neurologically.  Skin: Warm and dry, no rash or jaundice.   Psych: Alert and cooperative, normal mood and affect.  Labs: Lab Results  Component Value Date   CREATININE 0.89 06/21/2017  BUN 14 06/21/2017   NA 140 06/21/2017   K 3.6 06/21/2017   CL 107 06/21/2017   CO2 23 06/21/2017   Lab Results  Component Value Date   ALT 16 06/21/2017   AST 20 06/21/2017   ALKPHOS 96 06/21/2017   BILITOT 0.5 06/21/2017   Lab Results  Component Value Date   WBC 6.4 06/21/2017   HGB 12.0 06/21/2017   HCT 36.8 06/21/2017   MCV 84.6 06/21/2017   PLT 183 06/21/2017     Imaging Studies: No results found.

## 2018-04-27 NOTE — Progress Notes (Signed)
No pcp per patient 

## 2018-06-20 ENCOUNTER — Telehealth: Payer: Self-pay

## 2018-06-20 ENCOUNTER — Other Ambulatory Visit: Payer: Self-pay

## 2018-06-20 NOTE — Progress Notes (Signed)
Called and informed pt TCS will need to be rescheduled. Will call pt back when July schedule is available.

## 2018-06-20 NOTE — Telephone Encounter (Signed)
Per [facility]: REVIEWED-[facility] TCS AFTER JUN 1 DUE TO COVID 19 RESTRICTIONS. NEEDS PHENERGAN 6.25 MG IV IN PREOP.  Called and informed pt TCS will need to be rescheduled. Will call pt back when [facility] July schedule is available.

## 2018-06-21 ENCOUNTER — Other Ambulatory Visit: Payer: Self-pay

## 2018-06-21 DIAGNOSIS — Z1211 Encounter for screening for malignant neoplasm of colon: Secondary | ICD-10-CM

## 2018-06-21 NOTE — Telephone Encounter (Signed)
Pt called office, TCS w/[facility] rescheduled to 09/29/18 at 8:30am. New instructions mailed. Endo scheduler informed. Order entered for Phenergan 6.25mg  IV in pre-op (endo scheduler also informed).

## 2018-06-21 NOTE — Telephone Encounter (Signed)
Tried to call pt to reschedule TCS, no answer, unable to leave VM d/t no VM set-up.

## 2018-08-16 ENCOUNTER — Other Ambulatory Visit (HOSPITAL_COMMUNITY): Payer: Self-pay | Admitting: Internal Medicine

## 2018-08-16 DIAGNOSIS — C50212 Malignant neoplasm of upper-inner quadrant of left female breast: Secondary | ICD-10-CM

## 2018-08-16 DIAGNOSIS — Z780 Asymptomatic menopausal state: Secondary | ICD-10-CM

## 2018-08-17 ENCOUNTER — Other Ambulatory Visit (HOSPITAL_COMMUNITY): Payer: Self-pay | Admitting: Radiology

## 2018-08-21 ENCOUNTER — Other Ambulatory Visit (HOSPITAL_COMMUNITY): Payer: Self-pay | Admitting: Hematology

## 2018-08-21 DIAGNOSIS — C50212 Malignant neoplasm of upper-inner quadrant of left female breast: Secondary | ICD-10-CM

## 2018-08-21 DIAGNOSIS — Z170 Estrogen receptor positive status [ER+]: Secondary | ICD-10-CM

## 2018-08-21 DIAGNOSIS — Z780 Asymptomatic menopausal state: Secondary | ICD-10-CM

## 2018-08-22 ENCOUNTER — Ambulatory Visit (HOSPITAL_COMMUNITY)
Admission: RE | Admit: 2018-08-22 | Discharge: 2018-08-22 | Disposition: A | Discharge status: Home / Self Care | Payer: PRIVATE HEALTH INSURANCE | Source: Ambulatory Visit | Attending: Internal Medicine | Admitting: Internal Medicine

## 2018-08-22 ENCOUNTER — Ambulatory Visit (HOSPITAL_COMMUNITY): Admission: RE | Admit: 2018-08-22 | Payer: PRIVATE HEALTH INSURANCE | Source: Ambulatory Visit

## 2018-08-22 ENCOUNTER — Ambulatory Visit (HOSPITAL_COMMUNITY): Payer: PRIVATE HEALTH INSURANCE

## 2018-08-22 ENCOUNTER — Other Ambulatory Visit: Payer: Self-pay

## 2018-08-22 DIAGNOSIS — Z170 Estrogen receptor positive status [ER+]: Secondary | ICD-10-CM | POA: Diagnosis present

## 2018-08-22 DIAGNOSIS — Z780 Asymptomatic menopausal state: Secondary | ICD-10-CM | POA: Diagnosis present

## 2018-08-22 DIAGNOSIS — C50212 Malignant neoplasm of upper-inner quadrant of left female breast: Secondary | ICD-10-CM | POA: Insufficient documentation

## 2018-08-23 ENCOUNTER — Other Ambulatory Visit (HOSPITAL_COMMUNITY): Payer: Self-pay | Admitting: *Deleted

## 2018-08-23 DIAGNOSIS — C50219 Malignant neoplasm of upper-inner quadrant of unspecified female breast: Secondary | ICD-10-CM

## 2018-08-23 DIAGNOSIS — C50212 Malignant neoplasm of upper-inner quadrant of left female breast: Secondary | ICD-10-CM

## 2018-08-23 DIAGNOSIS — Z170 Estrogen receptor positive status [ER+]: Secondary | ICD-10-CM

## 2018-08-23 DIAGNOSIS — C50312 Malignant neoplasm of lower-inner quadrant of left female breast: Secondary | ICD-10-CM

## 2018-08-24 ENCOUNTER — Other Ambulatory Visit: Payer: Self-pay

## 2018-08-24 ENCOUNTER — Inpatient Hospital Stay (HOSPITAL_COMMUNITY): Payer: PRIVATE HEALTH INSURANCE | Attending: Hematology

## 2018-08-24 DIAGNOSIS — Z170 Estrogen receptor positive status [ER+]: Secondary | ICD-10-CM | POA: Diagnosis not present

## 2018-08-24 DIAGNOSIS — I10 Essential (primary) hypertension: Secondary | ICD-10-CM | POA: Insufficient documentation

## 2018-08-24 DIAGNOSIS — Z79811 Long term (current) use of aromatase inhibitors: Secondary | ICD-10-CM | POA: Diagnosis not present

## 2018-08-24 DIAGNOSIS — Z923 Personal history of irradiation: Secondary | ICD-10-CM | POA: Diagnosis not present

## 2018-08-24 DIAGNOSIS — C50219 Malignant neoplasm of upper-inner quadrant of unspecified female breast: Secondary | ICD-10-CM

## 2018-08-24 DIAGNOSIS — E119 Type 2 diabetes mellitus without complications: Secondary | ICD-10-CM | POA: Diagnosis not present

## 2018-08-24 DIAGNOSIS — Z9221 Personal history of antineoplastic chemotherapy: Secondary | ICD-10-CM | POA: Insufficient documentation

## 2018-08-24 DIAGNOSIS — E785 Hyperlipidemia, unspecified: Secondary | ICD-10-CM | POA: Insufficient documentation

## 2018-08-24 DIAGNOSIS — C50212 Malignant neoplasm of upper-inner quadrant of left female breast: Secondary | ICD-10-CM | POA: Diagnosis not present

## 2018-08-24 DIAGNOSIS — C50312 Malignant neoplasm of lower-inner quadrant of left female breast: Secondary | ICD-10-CM

## 2018-08-24 LAB — COMPREHENSIVE METABOLIC PANEL
ALT: 19 U/L (ref 0–44)
AST: 17 U/L (ref 15–41)
Albumin: 3.9 g/dL (ref 3.5–5.0)
Alkaline Phosphatase: 100 U/L (ref 38–126)
Anion gap: 10 (ref 5–15)
BUN: 12 mg/dL (ref 8–23)
CO2: 26 mmol/L (ref 22–32)
Calcium: 9.4 mg/dL (ref 8.9–10.3)
Chloride: 105 mmol/L (ref 98–111)
Creatinine, Ser: 0.82 mg/dL (ref 0.44–1.00)
GFR calc Af Amer: >60 mL/min (ref >60)
GFR calc non Af Amer: >60 mL/min (ref >60)
Glucose, Bld: 129 mg/dL — ABNORMAL HIGH (ref 70–99)
Potassium: 3.2 mmol/L — ABNORMAL LOW (ref 3.5–5.1)
Sodium: 141 mmol/L (ref 135–145)
Total Bilirubin: 0.4 mg/dL (ref 0.3–1.2)
Total Protein: 7.3 g/dL (ref 6.5–8.1)

## 2018-08-24 LAB — CBC WITH DIFFERENTIAL/PLATELET
Abs Immature Granulocytes: 0.01 10*3/uL (ref 0.00–0.07)
Basophils Absolute: 0.1 10*3/uL (ref 0.0–0.1)
Basophils Relative: 1 %
Eosinophils Absolute: 0.2 10*3/uL (ref 0.0–0.5)
Eosinophils Relative: 2 %
HCT: 39.7 % (ref 36.0–46.0)
Hemoglobin: 12.9 g/dL (ref 12.0–15.0)
Immature Granulocytes: 0 %
Lymphocytes Relative: 42 %
Lymphs Abs: 3 10*3/uL (ref 0.7–4.0)
MCH: 27.6 pg (ref 26.0–34.0)
MCHC: 32.5 g/dL (ref 30.0–36.0)
MCV: 84.8 fL (ref 80.0–100.0)
Monocytes Absolute: 0.5 10*3/uL (ref 0.1–1.0)
Monocytes Relative: 7 %
Neutro Abs: 3.3 10*3/uL (ref 1.7–7.7)
Neutrophils Relative %: 48 %
Platelets: 212 10*3/uL (ref 150–400)
RBC: 4.68 MIL/uL (ref 3.87–5.11)
RDW: 14 % (ref 11.5–15.5)
WBC: 7 10*3/uL (ref 4.0–10.5)
nRBC: 0 % (ref 0.0–0.2)

## 2018-08-24 LAB — LACTATE DEHYDROGENASE: LDH: 127 U/L (ref 98–192)

## 2018-08-25 ENCOUNTER — Encounter (HOSPITAL_COMMUNITY): Payer: Self-pay | Admitting: Hematology

## 2018-08-25 ENCOUNTER — Inpatient Hospital Stay (HOSPITAL_BASED_OUTPATIENT_CLINIC_OR_DEPARTMENT_OTHER): Payer: PRIVATE HEALTH INSURANCE | Admitting: Hematology

## 2018-08-25 ENCOUNTER — Other Ambulatory Visit (HOSPITAL_COMMUNITY): Payer: BLUE CROSS/BLUE SHIELD

## 2018-08-25 ENCOUNTER — Ambulatory Visit (HOSPITAL_COMMUNITY): Payer: BLUE CROSS/BLUE SHIELD | Admitting: Hematology

## 2018-08-25 DIAGNOSIS — Z79811 Long term (current) use of aromatase inhibitors: Secondary | ICD-10-CM

## 2018-08-25 DIAGNOSIS — Z923 Personal history of irradiation: Secondary | ICD-10-CM

## 2018-08-25 DIAGNOSIS — Z170 Estrogen receptor positive status [ER+]: Secondary | ICD-10-CM | POA: Diagnosis not present

## 2018-08-25 DIAGNOSIS — C50212 Malignant neoplasm of upper-inner quadrant of left female breast: Secondary | ICD-10-CM

## 2018-08-25 DIAGNOSIS — Z9221 Personal history of antineoplastic chemotherapy: Secondary | ICD-10-CM

## 2018-08-25 DIAGNOSIS — Z171 Estrogen receptor negative status [ER-]: Secondary | ICD-10-CM

## 2018-08-25 DIAGNOSIS — C50912 Malignant neoplasm of unspecified site of left female breast: Secondary | ICD-10-CM

## 2018-08-25 MED ORDER — NYSTATIN 100000 UNIT/GM EX POWD: CUTANEOUS | 6 refills | Status: AC

## 2018-08-25 MED ORDER — CITALOPRAM HYDROBROMIDE 10 MG PO TABS: 10.0000 mg | ORAL_TABLET | Freq: Every day | ORAL | 12 refills | Status: DC

## 2018-08-25 MED ORDER — CALCIUM CARBONATE-VITAMIN D 500-200 MG-UNIT PO TABS: 2.0000 | ORAL_TABLET | Freq: Every day | ORAL | 12 refills | Status: AC

## 2018-08-25 MED ORDER — D3 DOTS 50 MCG (2000 UT) PO TBDP: 1.0000 | ORAL_TABLET | Freq: Every day | ORAL | 12 refills | Status: AC

## 2018-08-25 NOTE — Assessment & Plan Note (Signed)
1.  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   - Treated at [facility] in [place].  - Initially treated with (L) lumpectomy by Dr. DeMason on 08/20/13.  - Received adjuvant chemotherapy with Adriamycin/Cytoxan x 4 cycles, followed by weekly Taxol x 12.  - Completed chemotherapy on 02/21/14.  -  Went on to complete adjuvant breast radiation therapy on 05/21/14. -  Initially started on anti-estrogen treatment with Tamoxifen in 05/2014.  - Remained on Tamoxifen until she transferred her care to [facility] in 12/2015, when her anti-estrogen therapy was transitioned to Arimidex daily in 12/2015.   - Pt had BCI testing done 07/19/2017 that showed a high likelihood of benefit of extended endocrine therapy.  Pt is advised to continue Arimidex for 10 years.   - Diagnostic mammogram completed Probably benign left breast lumpectomy calcifications which may represent developing fat necrosis. Bi-Rads 3- We will repeat diagnostic mammogram in 6 mths. - RTC in 6 mths.  2. Hot flashes secondary to aromatase inhibitor - Controlled with Celexa  

## 2018-08-25 NOTE — Progress Notes (Signed)
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   08/09/2013 Procedure   US- guided biopsy of left breast mass.   08/09/2013 Pathology Results   Invasive ductal carcinoma   08/20/2013 Procedure   Left lumpectomy and sentinel lymph node biopsy and modified axillary dissection by Dr. Anthony Sar.   08/20/2013 Pathology Results   Invasive tumor is 5 cm in largest dimension, grade 3, negative margins, 1/10 lymph node positive for micrometastasis and 1/10 lymph node positive for macro-metastasis, + LVI.  ER + > 90%, PR + > 90%, Ki-67 23%, HER2+ but ratio was 1.26.   09/27/2013 - 11/08/2013 Chemotherapy   AC x 4 cycles    12/06/2013 - 02/21/2014 Chemotherapy   Weekly Taxol x 12 cycles   04/02/2014 - 05/21/2014 Radiation Therapy   Dr. Pablo Ledger   05/27/2014 - 01/05/2016 Anti-estrogen oral therapy   Tamoxifen 20 mg daily.   01/05/2016 Adverse Reaction   Severe hot flashes.  She is 64 years old as of 01/04/2016.  She should be post-menopausal.   01/05/2016 -  Anti-estrogen oral therapy   Arimidex   02/02/2016 Imaging   Bone density- BMD as determined from Femur Neck Left is 0.927 g/cm2 with a T-Score of -0.8. This patient is considered normal according to [facility]) criteria.      CANCER STAGING: Cancer Staging Malignant neoplasm of left female breast [facility]) Staging form: Breast, AJCC 7th Edition - Clinical stage from 07/30/2013: Stage IIB (T2, N1, M0) - Signed by Baird Cancer, PA-C on  01/05/2016    INTERVAL HISTORY:  Ms. Alcoser 63 y.o. female presents today for follow-up.  Reports overall doing well.  She denies any significant fatigue.  She is currently on a REM index tolerating well.  She does experience hot flashes that is well controlled with Celexa.  She denies any new changes in her breast.  No lumps, masses, nipple discharge, or inversion.  She recently had repeat mammogram and is here to discuss results.   REVIEW OF SYSTEMS:  Review of Systems  Constitutional: Negative.   HENT:  Negative.   Eyes: Negative.   Respiratory: Negative.   Cardiovascular: Negative.   Gastrointestinal: Negative.   Endocrine: Negative.   Genitourinary: Negative.    Musculoskeletal: Negative.   Skin: Negative.   Neurological: Negative.   Hematological: Negative.   Psychiatric/Behavioral: Negative.      PAST MEDICAL/SURGICAL HISTORY:  Past Medical History:  Diagnosis Date  . Breast cancer, left ([place]) 01/05/2016   breast  . Diabetes mellitus without complication ([place])   . Hyperlipidemia   . Hypertension 2015   Past Surgical History:  Procedure Laterality Date  . BREAST BIOPSY Right 1998  . BREAST LUMPECTOMY Left 2015   Left lumpectomy and sentinel lymph node biopsy and modified axillary dissection by Dr. Anthony Sar  . PORT-A-CATH REMOVAL    . TUBAL LIGATION       SOCIAL HISTORY:  Social History   Socioeconomic History  . Marital status: Divorced  Spouse name: Not on file  . Number of children: 2  . Years of education: ged  . Highest education level: Not on file  Occupational History  . Occupation: dietician aid    Comment: [employer]  . Financial resource strain: Not on file  . Food insecurity    Worry: Not on file    Inability: Not on file  . Transportation needs    Medical: Not on file    Non-medical: Not on file  Tobacco Use  . Smoking status: Never Smoker  . Smokeless tobacco: Never Used  Substance and Sexual Activity   . Alcohol use: No  . Drug use: No  . Sexual activity: Yes    Birth control/protection: Post-menopausal  Lifestyle  . Physical activity    Days per week: Not on file    Minutes per session: Not on file  . Stress: Not on file  Relationships  . Social Herbalist on phone: Not on file    Gets together: Not on file    Attends religious service: Not on file    Active member of club or organization: Not on file    Attends meetings of clubs or organizations: Not on file    Relationship status: Not on file  . Intimate partner violence    Fear of current or ex partner: Not on file    Emotionally abused: Not on file    Physically abused: Not on file    Forced sexual activity: Not on file  Other Topics Concern  . Not on file  Social History Narrative   Lives alone   Two daughters are grown   Sometimes stays with sister [place]    FAMILY HISTORY:  Family History  Problem Relation Age of Onset  . Cancer Mother        breast  . COPD Father   . Emphysema Father   . Hypertension Sister   . Hyperlipidemia Sister   . Cancer Brother        lung  . Pulmonary embolism Maternal Aunt   . Diabetes Paternal Aunt   . Stroke Maternal Grandmother   . Stroke Maternal Grandfather   . Aneurysm Paternal Grandmother   . Stroke Paternal Grandfather   . Early death Brother 70       murder  . Hypertension Daughter   . Edema Daughter   . Colon cancer Neg Hx     CURRENT MEDICATIONS:  Outpatient Encounter Medications as of 08/25/2018  Medication Sig  . anastrozole (ARIMIDEX) 1 MG tablet Take 1 tablet (1 mg total) by mouth daily.  Marland Kitchen atorvastatin (LIPITOR) 20 MG tablet Take 1 tablet (20 mg total) by mouth daily.  . calcium-vitamin D (OSCAL WITH D) 500-200 MG-UNIT tablet Take 2 tablets by mouth daily with breakfast.  . Cholecalciferol (D3 DOTS) 50 MCG (2000 UT) TBDP Take 1 capsule by mouth daily.  . citalopram (CELEXA) 10 MG tablet Take 1 tablet (10 mg total) by mouth daily.  Marland Kitchen glimepiride  (AMARYL) 1 MG tablet TAKE 1/2 TABLET BY MOUTH ONCE DAILY  . hydrochlorothiazide (MICROZIDE) 12.5 MG capsule Take 1 capsule (12.5 mg total) daily by mouth.  . lovastatin (MEVACOR) 40 MG tablet Take 40 mg by mouth daily.  Marland Kitchen nystatin (MYCOSTATIN/NYSTOP) powder Apply powder under breasts twice daily  . potassium chloride SA (K-DUR,KLOR-CON) 20 MEQ tablet Take 1 tablet (20 mEq total) by mouth 2 (two) times daily. (Patient taking differently: Take 20 mEq by  mouth daily. )  . [DISCONTINUED] calcium-vitamin D (OSCAL WITH D) 500-200 MG-UNIT tablet Take 2 tablets by mouth daily with breakfast.  . [DISCONTINUED] Cholecalciferol (D3 DOTS) 2000 units TBDP Take 1 capsule by mouth daily.  . [DISCONTINUED] citalopram (CELEXA) 10 MG tablet Take 1 tablet (10 mg total) by mouth daily.  . [DISCONTINUED] nystatin (MYCOSTATIN/NYSTOP) powder Apply powder under breasts twice daily  . zolpidem (AMBIEN) 10 MG tablet Take 0.5-1 tablets (5-10 mg total) by mouth at bedtime as needed for sleep.   No facility-administered encounter medications on file as of 08/25/2018.     ALLERGIES:  Allergies  Allergen Reactions  . Effexor [Venlafaxine] Nausea And Vomiting    GI intolerance only  . Hydrocodone Nausea And Vomiting    GI intolerance only     PHYSICAL EXAM:  ECOG Performance status: 1  Vitals:   08/25/18 1100  BP: (!) 148/93  Pulse: 91  Resp: 16  Temp: 98.5 F (36.9 C)  SpO2: 100%   Filed Weights   08/25/18 1100  Weight: 160 lb 2 oz (72.6 kg)    Physical Exam Constitutional:      Appearance: Normal appearance. She is obese.  HENT:     Head: Normocephalic.     Nose: Nose normal.     Mouth/Throat:     Mouth: Mucous membranes are moist.     Pharynx: Oropharynx is clear.  Eyes:     Extraocular Movements: Extraocular movements intact.     Conjunctiva/sclera: Conjunctivae normal.  Neck:     Musculoskeletal: Normal range of motion.  Cardiovascular:     Rate and Rhythm: Normal rate and regular rhythm.      Pulses: Normal pulses.     Heart sounds: Normal heart sounds.  Pulmonary:     Effort: Pulmonary effort is normal.     Breath sounds: Normal breath sounds.  Abdominal:     General: Bowel sounds are normal.     Palpations: Abdomen is soft.  Musculoskeletal: Normal range of motion.  Skin:    General: Skin is warm and dry.  Neurological:     Mental Status: She is oriented to person, place, and time. Mental status is at baseline.  Psychiatric:        Mood and Affect: Mood normal.        Behavior: Behavior normal.        Thought Content: Thought content normal.        Judgment: Judgment normal.      LABORATORY DATA:  I have reviewed the labs as listed.  CBC    Component Value Date/Time   WBC 7.0 08/24/2018 1214   RBC 4.68 08/24/2018 1214   HGB 12.9 08/24/2018 1214   HCT 39.7 08/24/2018 1214   PLT 212 08/24/2018 1214   MCV 84.8 08/24/2018 1214   MCH 27.6 08/24/2018 1214   MCHC 32.5 08/24/2018 1214   RDW 14.0 08/24/2018 1214   LYMPHSABS 3.0 08/24/2018 1214   MONOABS 0.5 08/24/2018 1214   EOSABS 0.2 08/24/2018 1214   BASOSABS 0.1 08/24/2018 1214   CMP Latest Ref Rng & Units 08/24/2018 06/21/2017 12/27/2016  Glucose 70 - 99 mg/dL 129(H) 137(H) 109(H)  BUN 8 - 23 mg/dL '12 14 16  '$ Creatinine 0.44 - 1.00 mg/dL 0.82 0.89 1.03(H)  Sodium 135 - 145 mmol/L 141 140 139  Potassium 3.5 - 5.1 mmol/L 3.2(L) 3.6 3.3(L)  Chloride 98 - 111 mmol/L 105 107 104  CO2 22 - 32 mmol/L '26 23 28  '$ Calcium 8.9 -  10.3 mg/dL 9.4 9.0 9.0  Total Protein 6.5 - 8.1 g/dL 7.3 7.3 7.0  Total Bilirubin 0.3 - 1.2 mg/dL 0.4 0.5 0.8  Alkaline Phos 38 - 126 U/L 100 96 94  AST 15 - 41 U/L '17 20 17  '$ ALT 0 - 44 U/L '19 16 15     '$ ASSESSMENT & PLAN:   Malignant neoplasm of left female breast ([place]) 1.  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   - Treated at [facility] in [place].  - Initially treated with (L) lumpectomy by Dr. Anthony Sar on  08/20/13.  - Received adjuvant chemotherapy with Adriamycin/Cytoxan x 4 cycles, followed by weekly Taxol x 12.  - Completed chemotherapy on 02/21/14.  Martin Majestic on to complete adjuvant breast radiation therapy on 05/21/14. -  Initially started on anti-estrogen treatment with Tamoxifen in 05/2014.  - Remained on Tamoxifen until she transferred her care to [facility] in 12/2015, when her anti-estrogen therapy was transitioned to Arimidex daily in 12/2015.   - Pt had BCI testing done 07/19/2017 that showed a high likelihood of benefit of extended endocrine therapy.  Pt is advised to continue Arimidex for 10 years.   - Diagnostic mammogram completed Probably benign left breast lumpectomy calcifications which may represent developing fat necrosis. Bi-Rads 3- We will repeat diagnostic mammogram in 6 mths. - RTC in 6 mths.  2. Hot flashes secondary to aromatase inhibitor - Controlled with Celexa       Orders placed this encounter:  Orders Placed This Encounter  Procedures  . MM Digital Diagnostic [place] [phone]

## 2018-09-13 ENCOUNTER — Telehealth: Payer: Self-pay | Admitting: Gastroenterology

## 2018-09-13 ENCOUNTER — Telehealth: Payer: Self-pay | Admitting: *Deleted

## 2018-09-13 NOTE — Telephone Encounter (Signed)
See prior phone note. 

## 2018-09-13 NOTE — Telephone Encounter (Signed)
Pt was returning a call from [place]. She said to call her cell # [phone]

## 2018-09-13 NOTE — Telephone Encounter (Signed)
Patient called back. Spoke with her. She is aware she will need COVID-19 testing done 3 days prior to procedure. She is scheduled for 7/14 at 3:10pm. Patient aware of location for testing. Aware she will need to remain quarantined at home after testing until procedure is done. She voiced understanding

## 2018-09-13 NOTE — Telephone Encounter (Signed)
Called home #, pt NA. Called mobile #-VM not set up.

## 2018-09-25 ENCOUNTER — Telehealth: Payer: Self-pay | Admitting: Gastroenterology

## 2018-09-25 NOTE — Telephone Encounter (Signed)
Called pt, TCS w/SLF rescheduled to 11/10/18 at 8:30am. COVID test rescheduled to 11/08/18 at 3:25pm (pt aware to quarantine at home after the test until procedure). Endo scheduler informed. Appt letter mailed with new procedure instructions.

## 2018-09-25 NOTE — Telephone Encounter (Signed)
Pt has a family emergency where she needs to go out of town and will need to reschedule her procedure with [facility] for this Friday. Please call [phone]

## 2018-09-26 ENCOUNTER — Other Ambulatory Visit (HOSPITAL_COMMUNITY): Payer: PRIVATE HEALTH INSURANCE

## 2018-10-20 ENCOUNTER — Other Ambulatory Visit (HOSPITAL_COMMUNITY): Payer: Self-pay | Admitting: *Deleted

## 2018-10-20 MED ORDER — CITALOPRAM HYDROBROMIDE 10 MG PO TABS: 10.0000 mg | ORAL_TABLET | Freq: Every day | ORAL | 3 refills | Status: DC

## 2018-11-08 ENCOUNTER — Other Ambulatory Visit: Payer: Self-pay

## 2018-11-08 ENCOUNTER — Other Ambulatory Visit (HOSPITAL_COMMUNITY)
Admission: RE | Admit: 2018-11-08 | Discharge: 2018-11-08 | Disposition: A | Discharge status: Home / Self Care | Payer: PRIVATE HEALTH INSURANCE | Source: Ambulatory Visit | Attending: Gastroenterology | Admitting: Gastroenterology

## 2018-11-09 ENCOUNTER — Telehealth: Payer: Self-pay | Admitting: Gastroenterology

## 2018-11-09 NOTE — Telephone Encounter (Signed)
Pt hasn't called back. LMOVM for endo scheduler to cancel TCS for tomorrow d/t she hasn't done COVID test.  FYI to LSL.

## 2018-11-09 NOTE — Telephone Encounter (Signed)
Tried to call mobile number, no answer, no VM set-up. Called home number and spoke to sister (sister not listed on DPR), left message for pt to call office.

## 2018-11-09 NOTE — Telephone Encounter (Signed)
Laura Moon called to say that pt didn't have covid test done yesterday and is scheduled with [facility] tomorrow.

## 2018-11-10 ENCOUNTER — Ambulatory Visit (HOSPITAL_COMMUNITY)
Admission: RE | Admit: 2018-11-10 | Payer: PRIVATE HEALTH INSURANCE | Source: Home / Self Care | Admitting: Gastroenterology

## 2018-11-10 ENCOUNTER — Encounter (HOSPITAL_COMMUNITY): Admission: RE | Payer: Self-pay | Source: Home / Self Care

## 2018-11-10 SURGERY — COLONOSCOPY
Anesthesia: Moderate Sedation

## 2018-11-13 NOTE — Telephone Encounter (Signed)
noted 

## 2019-02-14 ENCOUNTER — Other Ambulatory Visit (HOSPITAL_COMMUNITY): Payer: Self-pay | Admitting: Hematology

## 2019-02-14 DIAGNOSIS — R928 Other abnormal and inconclusive findings on diagnostic imaging of breast: Secondary | ICD-10-CM

## 2019-02-20 ENCOUNTER — Other Ambulatory Visit (HOSPITAL_COMMUNITY): Payer: Self-pay | Admitting: Hematology

## 2019-02-20 DIAGNOSIS — C50912 Malignant neoplasm of unspecified site of left female breast: Secondary | ICD-10-CM

## 2019-02-20 DIAGNOSIS — Z171 Estrogen receptor negative status [ER-]: Secondary | ICD-10-CM

## 2019-02-26 ENCOUNTER — Other Ambulatory Visit (HOSPITAL_COMMUNITY): Payer: Self-pay | Admitting: *Deleted

## 2019-02-26 DIAGNOSIS — C50912 Malignant neoplasm of unspecified site of left female breast: Secondary | ICD-10-CM

## 2019-02-26 DIAGNOSIS — Z171 Estrogen receptor negative status [ER-]: Secondary | ICD-10-CM

## 2019-02-26 DIAGNOSIS — C50212 Malignant neoplasm of upper-inner quadrant of left female breast: Secondary | ICD-10-CM

## 2019-02-27 ENCOUNTER — Ambulatory Visit (HOSPITAL_COMMUNITY): Admission: RE | Admit: 2019-02-27 | Payer: PRIVATE HEALTH INSURANCE | Source: Ambulatory Visit

## 2019-02-27 ENCOUNTER — Ambulatory Visit (HOSPITAL_COMMUNITY): Payer: PRIVATE HEALTH INSURANCE

## 2019-02-27 ENCOUNTER — Ambulatory Visit (HOSPITAL_COMMUNITY)
Admission: RE | Admit: 2019-02-27 | Discharge: 2019-02-27 | Disposition: A | Discharge status: Home / Self Care | Payer: PRIVATE HEALTH INSURANCE | Source: Ambulatory Visit | Attending: Hematology | Admitting: Hematology

## 2019-02-27 ENCOUNTER — Inpatient Hospital Stay (HOSPITAL_COMMUNITY): Payer: PRIVATE HEALTH INSURANCE | Attending: Hematology

## 2019-02-27 ENCOUNTER — Other Ambulatory Visit: Payer: Self-pay

## 2019-02-27 DIAGNOSIS — C50912 Malignant neoplasm of unspecified site of left female breast: Secondary | ICD-10-CM | POA: Insufficient documentation

## 2019-02-27 DIAGNOSIS — Z171 Estrogen receptor negative status [ER-]: Secondary | ICD-10-CM | POA: Diagnosis present

## 2019-02-27 DIAGNOSIS — Z170 Estrogen receptor positive status [ER+]: Secondary | ICD-10-CM

## 2019-02-27 DIAGNOSIS — C50212 Malignant neoplasm of upper-inner quadrant of left female breast: Secondary | ICD-10-CM

## 2019-02-27 LAB — CBC WITH DIFFERENTIAL/PLATELET
Abs Immature Granulocytes: 0.02 10*3/uL (ref 0.00–0.07)
Basophils Absolute: 0.1 10*3/uL (ref 0.0–0.1)
Basophils Relative: 1 %
Eosinophils Absolute: 0.2 10*3/uL (ref 0.0–0.5)
Eosinophils Relative: 3 %
HCT: 39.8 % (ref 36.0–46.0)
Hemoglobin: 12.9 g/dL (ref 12.0–15.0)
Immature Granulocytes: 0 %
Lymphocytes Relative: 46 %
Lymphs Abs: 2.5 10*3/uL (ref 0.7–4.0)
MCH: 28 pg (ref 26.0–34.0)
MCHC: 32.4 g/dL (ref 30.0–36.0)
MCV: 86.3 fL (ref 80.0–100.0)
Monocytes Absolute: 0.5 10*3/uL (ref 0.1–1.0)
Monocytes Relative: 9 %
Neutro Abs: 2.2 10*3/uL (ref 1.7–7.7)
Neutrophils Relative %: 41 %
Platelets: 221 10*3/uL (ref 150–400)
RBC: 4.61 MIL/uL (ref 3.87–5.11)
RDW: 14 % (ref 11.5–15.5)
WBC: 5.4 10*3/uL (ref 4.0–10.5)
nRBC: 0 % (ref 0.0–0.2)

## 2019-02-27 LAB — COMPREHENSIVE METABOLIC PANEL
ALT: 19 U/L (ref 0–44)
AST: 17 U/L (ref 15–41)
Albumin: 3.7 g/dL (ref 3.5–5.0)
Alkaline Phosphatase: 81 U/L (ref 38–126)
Anion gap: 7 (ref 5–15)
BUN: 16 mg/dL (ref 8–23)
CO2: 27 mmol/L (ref 22–32)
Calcium: 9 mg/dL (ref 8.9–10.3)
Chloride: 107 mmol/L (ref 98–111)
Creatinine, Ser: 0.78 mg/dL (ref 0.44–1.00)
GFR calc Af Amer: >60 mL/min (ref >60)
GFR calc non Af Amer: >60 mL/min (ref >60)
Glucose, Bld: 79 mg/dL (ref 70–99)
Potassium: 3.7 mmol/L (ref 3.5–5.1)
Sodium: 141 mmol/L (ref 135–145)
Total Bilirubin: 0.6 mg/dL (ref 0.3–1.2)
Total Protein: 7.3 g/dL (ref 6.5–8.1)

## 2019-02-27 LAB — LACTATE DEHYDROGENASE: LDH: 136 U/L (ref 98–192)

## 2019-03-02 ENCOUNTER — Other Ambulatory Visit: Payer: Self-pay

## 2019-03-02 ENCOUNTER — Inpatient Hospital Stay (HOSPITAL_BASED_OUTPATIENT_CLINIC_OR_DEPARTMENT_OTHER): Payer: PRIVATE HEALTH INSURANCE | Admitting: Hematology

## 2019-03-02 ENCOUNTER — Encounter (HOSPITAL_COMMUNITY): Payer: Self-pay | Admitting: Hematology

## 2019-03-02 ENCOUNTER — Other Ambulatory Visit (HOSPITAL_COMMUNITY): Payer: Self-pay | Admitting: Hematology

## 2019-03-02 VITALS — BP 133/88 | HR 77 | Temp 97.9°F | Resp 16 | Wt 159.4 lb

## 2019-03-02 DIAGNOSIS — Z79811 Long term (current) use of aromatase inhibitors: Secondary | ICD-10-CM | POA: Diagnosis not present

## 2019-03-02 DIAGNOSIS — C50912 Malignant neoplasm of unspecified site of left female breast: Secondary | ICD-10-CM

## 2019-03-02 DIAGNOSIS — I10 Essential (primary) hypertension: Secondary | ICD-10-CM | POA: Insufficient documentation

## 2019-03-02 DIAGNOSIS — C50212 Malignant neoplasm of upper-inner quadrant of left female breast: Secondary | ICD-10-CM | POA: Diagnosis present

## 2019-03-02 DIAGNOSIS — E119 Type 2 diabetes mellitus without complications: Secondary | ICD-10-CM | POA: Diagnosis not present

## 2019-03-02 DIAGNOSIS — Z170 Estrogen receptor positive status [ER+]: Secondary | ICD-10-CM | POA: Diagnosis not present

## 2019-03-02 NOTE — Assessment & Plan Note (Signed)
1.  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   - Treated at [facility] in [place].  - Initially treated with (L) lumpectomy by Dr. Anthony Sar on 08/20/13.  - Received adjuvant chemotherapy with Adriamycin/Cytoxan x 4 cycles, followed by weekly Taxol x 12.  - Completed chemotherapy on 02/21/14.  Martin Majestic on to complete adjuvant breast radiation therapy on 05/21/14. -  Initially started on anti-estrogen treatment with Tamoxifen in 05/2014.  - Remained on Tamoxifen until she transferred her care to [facility] in 12/2015, when her anti-estrogen therapy was transitioned to Arimidex daily in 12/2015.   - Pt had BCI testing done 07/19/2017 that showed a high likelihood of benefit of extended endocrine therapy.  Pt is advised to continue Arimidex for 10 years.   - Diagnostic mammogram completed Probably benign left breast lumpectomy calcifications which may represent developing fat necrosis. Bi-Rads 3- We will repeat diagnostic mammogram in 6 mths. - RTC in 6 mths.  2. Hot flashes secondary to aromatase inhibitor - Controlled with Celexa

## 2019-03-02 NOTE — Progress Notes (Signed)
[place], [place] [address]   CLINIC:  Medical Oncology/Hematology  PCP:  Patient, No Pcp Per No address on file None   REASON FOR VISIT:  Follow-up for Breast Cancer   CURRENT THERAPY: Arimidex  BRIEF ONCOLOGIC HISTORY:  Oncology History  Malignant neoplasm of left female breast ([place])  07/30/2013 Mammogram   Mammogram- 5 cm from nipple, 1.6 x 1.2 x 1.0 cm spiculated, irregular mass in left breast at 11 o'clock position.   08/09/2013 Procedure   US- guided biopsy of left breast mass.   08/09/2013 Pathology Results   Invasive ductal carcinoma   08/20/2013 Procedure   Left lumpectomy and sentinel lymph node biopsy and modified axillary dissection by Dr. Anthony Sar.   08/20/2013 Pathology Results   Invasive tumor is 5 cm in largest dimension, grade 3, negative margins, 1/10 lymph node positive for micrometastasis and 1/10 lymph node positive for macro-metastasis, + LVI.  ER + > 90%, PR + > 90%, Ki-67 23%, HER2+ but ratio was 1.26.   09/27/2013 - 11/08/2013 Chemotherapy   AC x 4 cycles    12/06/2013 - 02/21/2014 Chemotherapy   Weekly Taxol x 12 cycles   04/02/2014 - 05/21/2014 Radiation Therapy   Dr. Pablo Ledger   05/27/2014 - 01/05/2016 Anti-estrogen oral therapy   Tamoxifen 20 mg daily.   01/05/2016 Adverse Reaction   Severe hot flashes.  She is 63 years old as of 01/04/2016.  She should be post-menopausal.   01/05/2016 -  Anti-estrogen oral therapy   Arimidex   02/02/2016 Imaging   Bone density- BMD as determined from Femur Neck Left is 0.927 g/cm2 with a T-Score of -0.8. This patient is considered normal according to [facility]) criteria.      CANCER STAGING: Cancer Staging Malignant neoplasm of left female breast [facility]) Staging form: Breast, AJCC 7th Edition - Clinical stage from 07/30/2013: Stage IIB (T2, N1, M0) - Signed by Baird Cancer, PA-C on 01/05/2016    INTERVAL HISTORY:  Laura Moon 63 y.o.  female presents today for follow-up.  She reports overall doing well.  She denies any change in her health history since her last office visit.  She denies any change in bowel habits.  No weight loss.  Appetite is stable.  She denies any changes in her breasts.  No new lumps, masses, nipple inversion or discharge.  She is here to review most recent mammogram and discuss recent labs.   REVIEW OF SYSTEMS:  Review of Systems  All other systems reviewed and are negative.    PAST MEDICAL/SURGICAL HISTORY:  Past Medical History:  Diagnosis Date  . Breast cancer, left ([place]) 01/05/2016   breast  . Diabetes mellitus without complication ([place])   . Hyperlipidemia   . Hypertension 2015   Past Surgical History:  Procedure Laterality Date  . BREAST BIOPSY Right 1998  . BREAST LUMPECTOMY Left 2015   Left lumpectomy and sentinel lymph node biopsy and modified axillary dissection by Dr. Anthony Sar  . PORT-A-CATH REMOVAL    . TUBAL LIGATION       SOCIAL HISTORY:  Social History   Socioeconomic History  . Marital status: Divorced    Spouse name: Not on file  . Number of children: 2  . Years of education: ged  . Highest education level: Not on file  Occupational History  . Occupation: dietician aid    Comment: [employer]  Tobacco Use  . Smoking status: Never Smoker  .  Smokeless tobacco: Never Used  Substance and Sexual Activity  . Alcohol use: No  . Drug use: No  . Sexual activity: Yes    Birth control/protection: Post-menopausal  Other Topics Concern  . Not on file  Social History Narrative   Lives alone   Two daughters are grown   Sometimes stays with sister [place]   Social Determinants of Health   Financial Resource Strain:   . Difficulty of Paying Living Expenses: Not on file  Food Insecurity:   . Worried About Charity fundraiser in the Last Year: Not on file  . Ran Out of Food in the Last Year: Not on file  Transportation Needs:   . Lack of Transportation  (Medical): Not on file  . Lack of Transportation (Non-Medical): Not on file  Physical Activity:   . Days of Exercise per Week: Not on file  . Minutes of Exercise per Session: Not on file  Stress:   . Feeling of Stress : Not on file  Social Connections:   . Frequency of Communication with Friends and Family: Not on file  . Frequency of Social Gatherings with Friends and Family: Not on file  . Attends Religious Services: Not on file  . Active Member of Clubs or Organizations: Not on file  . Attends Archivist Meetings: Not on file  . Marital Status: Not on file  Intimate Partner Violence:   . Fear of Current or Ex-Partner: Not on file  . Emotionally Abused: Not on file  . Physically Abused: Not on file  . Sexually Abused: Not on file    FAMILY HISTORY:  Family History  Problem Relation Age of Onset  . Cancer Mother        breast  . COPD Father   . Emphysema Father   . Hypertension Sister   . Hyperlipidemia Sister   . Cancer Brother        lung  . Pulmonary embolism Maternal Aunt   . Diabetes Paternal Aunt   . Stroke Maternal Grandmother   . Stroke Maternal Grandfather   . Aneurysm Paternal Grandmother   . Stroke Paternal Grandfather   . Early death Brother 31       murder  . Hypertension Daughter   . Edema Daughter   . Colon cancer Neg Hx     CURRENT MEDICATIONS:  Outpatient Encounter Medications as of 03/02/2019  Medication Sig  . anastrozole (ARIMIDEX) 1 MG tablet Take 1 tablet (1 mg total) by mouth daily.  Marland Kitchen atorvastatin (LIPITOR) 20 MG tablet Take 1 tablet (20 mg total) by mouth daily.  . calcium-vitamin D (OSCAL WITH D) 500-200 MG-UNIT tablet Take 2 tablets by mouth daily with breakfast.  . Cholecalciferol (D3 DOTS) 50 MCG (2000 UT) TBDP Take 1 capsule by mouth daily.  . citalopram (CELEXA) 10 MG tablet Take 1 tablet (10 mg total) by mouth daily.  Marland Kitchen glimepiride (AMARYL) 1 MG tablet TAKE 1/2 TABLET BY MOUTH ONCE DAILY  . hydrochlorothiazide  (MICROZIDE) 12.5 MG capsule Take 1 capsule (12.5 mg total) daily by mouth.  . lovastatin (MEVACOR) 40 MG tablet Take 40 mg by mouth daily.  Marland Kitchen nystatin (MYCOSTATIN/NYSTOP) powder Apply powder under breasts twice daily  . OS-CAL CALCIUM + D3 500-200 MG-UNIT TABS Take 2 tablets by mouth every morning.  . potassium chloride SA (K-DUR,KLOR-CON) 20 MEQ tablet Take 1 tablet (20 mEq total) by mouth 2 (two) times daily. (Patient taking differently: Take 20 mEq by mouth daily. )  .  zolpidem (AMBIEN) 10 MG tablet Take 0.5-1 tablets (5-10 mg total) by mouth at bedtime as needed for sleep.   No facility-administered encounter medications on file as of 03/02/2019.    ALLERGIES:  Allergies  Allergen Reactions  . Effexor [Venlafaxine] Nausea And Vomiting    GI intolerance only  . Hydrocodone Nausea And Vomiting    GI intolerance only     PHYSICAL EXAM:  ECOG Performance status: 1  Vitals:   03/02/19 0900  BP: 133/88  Pulse: 77  Resp: 16  Temp: 97.9 F (36.6 C)  SpO2: 100%   Filed Weights   03/02/19 0900  Weight: 159 lb 6 oz (72.3 kg)    Physical Exam Constitutional:      Appearance: Normal appearance.  HENT:     Head: Normocephalic.     Right Ear: External ear normal.     Left Ear: External ear normal.     Nose: Nose normal.     Mouth/Throat:     Pharynx: Oropharynx is clear.  Eyes:     Conjunctiva/sclera: Conjunctivae normal.  Cardiovascular:     Rate and Rhythm: Normal rate and regular rhythm.     Pulses: Normal pulses.     Heart sounds: Normal heart sounds.  Pulmonary:     Effort: Pulmonary effort is normal.     Breath sounds: Normal breath sounds.  Abdominal:     General: Bowel sounds are normal.  Musculoskeletal:        General: Normal range of motion.     Cervical back: Normal range of motion.  Skin:    General: Skin is warm.  Neurological:     General: No focal deficit present.     Mental Status: She is alert and oriented to person, place, and time.   Psychiatric:        Mood and Affect: Mood normal.        Behavior: Behavior normal.      LABORATORY DATA:  I have reviewed the labs as listed.  CBC    Component Value Date/Time   WBC 5.4 02/27/2019 1022   RBC 4.61 02/27/2019 1022   HGB 12.9 02/27/2019 1022   HCT 39.8 02/27/2019 1022   PLT 221 02/27/2019 1022   MCV 86.3 02/27/2019 1022   MCH 28.0 02/27/2019 1022   MCHC 32.4 02/27/2019 1022   RDW 14.0 02/27/2019 1022   LYMPHSABS 2.5 02/27/2019 1022   MONOABS 0.5 02/27/2019 1022   EOSABS 0.2 02/27/2019 1022   BASOSABS 0.1 02/27/2019 1022   CMP Latest Ref Rng & Units 02/27/2019 08/24/2018 06/21/2017  Glucose 70 - 99 mg/dL 79 129(H) 137(H)  BUN 8 - 23 mg/dL _0 Creatinine 0.44 - 1.00 mg/dL 0.78 0.82 0.89  Sodium 135 - 145 mmol/L 141 141 140  Potassium 3.5 - 5.1 mmol/L 3.7 3.2(L) 3.6  Chloride 98 - 111 mmol/L 107 105 107  CO2 22 - 32 mmol/L _1 Calcium 8.9 - 10.3 mg/dL 9.0 9.4 9.0  Total Protein 6.5 - 8.1 g/dL 7.3 7.3 7.3  Total Bilirubin 0.3 - 1.2 mg/dL 0.6 0.4 0.5  Alkaline Phos 38 - 126 U/L 81 100 96  AST 15 - 41 U/L _2 ALT 0 - 44 U/L _3 ASSESSMENT & PLAN:   Malignant neoplasm of left female breast (HCC) 1.  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   -  Treated at [facility] in [place].  - Initially treated with (L) lumpectomy by Dr. Anthony Sar on 08/20/13.  - Received adjuvant chemotherapy with Adriamycin/Cytoxan x 4 cycles, followed by weekly Taxol x 12.  - Completed chemotherapy on 02/21/14.  Martin Majestic on to complete adjuvant breast radiation therapy on 05/21/14. -  Initially started on anti-estrogen treatment with Tamoxifen in 05/2014.  - Remained on Tamoxifen until she transferred her care to [facility] in 12/2015, when her anti-estrogen therapy was transitioned to Arimidex daily in 12/2015.   - Pt had BCI testing done 07/19/2017 that showed a high  likelihood of benefit of extended endocrine therapy.  Pt is advised to continue Arimidex for 10 years.   - Diagnostic mammogram completed Probably benign left breast lumpectomy calcifications which may represent developing fat necrosis. Bi-Rads 3- We will repeat diagnostic mammogram in 6 mths. - RTC in 6 mths.  2. Hot flashes secondary to aromatase inhibitor - Controlled with Celexa       Orders placed this encounter:  Orders Placed This Encounter  Procedures  . MM DIAG BREAST TOMO UNI LEFT      Roger Shelter, [place] [phone]

## 2019-04-28 ENCOUNTER — Other Ambulatory Visit (HOSPITAL_COMMUNITY): Payer: Self-pay | Admitting: Internal Medicine

## 2019-04-28 DIAGNOSIS — C50212 Malignant neoplasm of upper-inner quadrant of left female breast: Secondary | ICD-10-CM

## 2019-04-28 DIAGNOSIS — Z170 Estrogen receptor positive status [ER+]: Secondary | ICD-10-CM

## 2019-04-30 ENCOUNTER — Other Ambulatory Visit (HOSPITAL_COMMUNITY): Payer: Self-pay | Admitting: *Deleted

## 2019-04-30 ENCOUNTER — Telehealth (HOSPITAL_COMMUNITY): Payer: Self-pay | Admitting: *Deleted

## 2019-04-30 DIAGNOSIS — Z170 Estrogen receptor positive status [ER+]: Secondary | ICD-10-CM

## 2019-04-30 DIAGNOSIS — C50212 Malignant neoplasm of upper-inner quadrant of left female breast: Secondary | ICD-10-CM

## 2019-04-30 MED ORDER — ANASTROZOLE 1 MG PO TABS: 1.0000 mg | ORAL_TABLET | Freq: Every day | ORAL | 4 refills | Status: DC

## 2019-08-15 ENCOUNTER — Other Ambulatory Visit (HOSPITAL_COMMUNITY): Payer: Self-pay | Admitting: Nurse Practitioner

## 2019-08-15 ENCOUNTER — Other Ambulatory Visit (HOSPITAL_COMMUNITY): Payer: Self-pay | Admitting: Hematology

## 2019-08-15 DIAGNOSIS — Z170 Estrogen receptor positive status [ER+]: Secondary | ICD-10-CM

## 2019-08-15 DIAGNOSIS — C50912 Malignant neoplasm of unspecified site of left female breast: Secondary | ICD-10-CM

## 2019-08-27 ENCOUNTER — Other Ambulatory Visit (HOSPITAL_COMMUNITY): Payer: Self-pay | Admitting: *Deleted

## 2019-08-27 DIAGNOSIS — C50212 Malignant neoplasm of upper-inner quadrant of left female breast: Secondary | ICD-10-CM

## 2019-08-28 ENCOUNTER — Ambulatory Visit (HOSPITAL_COMMUNITY): Payer: No Typology Code available for payment source

## 2019-08-28 ENCOUNTER — Other Ambulatory Visit: Payer: Self-pay

## 2019-08-28 ENCOUNTER — Other Ambulatory Visit (HOSPITAL_COMMUNITY): Payer: PRIVATE HEALTH INSURANCE

## 2019-08-28 ENCOUNTER — Encounter (HOSPITAL_COMMUNITY): Payer: PRIVATE HEALTH INSURANCE

## 2019-08-28 ENCOUNTER — Inpatient Hospital Stay (HOSPITAL_COMMUNITY): Payer: No Typology Code available for payment source | Attending: Hematology

## 2019-08-28 ENCOUNTER — Inpatient Hospital Stay (HOSPITAL_COMMUNITY): Admission: RE | Admit: 2019-08-28 | Payer: PRIVATE HEALTH INSURANCE | Source: Ambulatory Visit

## 2019-08-28 DIAGNOSIS — N951 Menopausal and female climacteric states: Secondary | ICD-10-CM | POA: Insufficient documentation

## 2019-08-28 DIAGNOSIS — Z923 Personal history of irradiation: Secondary | ICD-10-CM | POA: Insufficient documentation

## 2019-08-28 DIAGNOSIS — C50212 Malignant neoplasm of upper-inner quadrant of left female breast: Secondary | ICD-10-CM

## 2019-08-28 DIAGNOSIS — Z79811 Long term (current) use of aromatase inhibitors: Secondary | ICD-10-CM | POA: Insufficient documentation

## 2019-08-28 DIAGNOSIS — Z9221 Personal history of antineoplastic chemotherapy: Secondary | ICD-10-CM | POA: Diagnosis not present

## 2019-08-28 DIAGNOSIS — C50912 Malignant neoplasm of unspecified site of left female breast: Secondary | ICD-10-CM | POA: Diagnosis not present

## 2019-08-28 DIAGNOSIS — Z170 Estrogen receptor positive status [ER+]: Secondary | ICD-10-CM

## 2019-08-28 LAB — CBC WITH DIFFERENTIAL/PLATELET
Abs Immature Granulocytes: 0.01 10*3/uL (ref 0.00–0.07)
Basophils Absolute: 0.1 10*3/uL (ref 0.0–0.1)
Basophils Relative: 1 %
Eosinophils Absolute: 0.2 10*3/uL (ref 0.0–0.5)
Eosinophils Relative: 2 %
HCT: 37.6 % (ref 36.0–46.0)
Hemoglobin: 12.3 g/dL (ref 12.0–15.0)
Immature Granulocytes: 0 %
Lymphocytes Relative: 44 %
Lymphs Abs: 3 10*3/uL (ref 0.7–4.0)
MCH: 28.4 pg (ref 26.0–34.0)
MCHC: 32.7 g/dL (ref 30.0–36.0)
MCV: 86.8 fL (ref 80.0–100.0)
Monocytes Absolute: 0.4 10*3/uL (ref 0.1–1.0)
Monocytes Relative: 6 %
Neutro Abs: 3.2 10*3/uL (ref 1.7–7.7)
Neutrophils Relative %: 47 %
Platelets: 200 10*3/uL (ref 150–400)
RBC: 4.33 MIL/uL (ref 3.87–5.11)
RDW: 14 % (ref 11.5–15.5)
WBC: 6.8 10*3/uL (ref 4.0–10.5)
nRBC: 0 % (ref 0.0–0.2)

## 2019-08-28 LAB — COMPREHENSIVE METABOLIC PANEL
ALT: 19 U/L (ref 0–44)
AST: 19 U/L (ref 15–41)
Albumin: 3.7 g/dL (ref 3.5–5.0)
Alkaline Phosphatase: 77 U/L (ref 38–126)
Anion gap: 12 (ref 5–15)
BUN: 16 mg/dL (ref 8–23)
CO2: 25 mmol/L (ref 22–32)
Calcium: 9 mg/dL (ref 8.9–10.3)
Chloride: 102 mmol/L (ref 98–111)
Creatinine, Ser: 0.96 mg/dL (ref 0.44–1.00)
GFR calc Af Amer: >60 mL/min (ref >60)
GFR calc non Af Amer: >60 mL/min (ref >60)
Glucose, Bld: 124 mg/dL — ABNORMAL HIGH (ref 70–99)
Potassium: 3 mmol/L — ABNORMAL LOW (ref 3.5–5.1)
Sodium: 139 mmol/L (ref 135–145)
Total Bilirubin: 0.4 mg/dL (ref 0.3–1.2)
Total Protein: 7.1 g/dL (ref 6.5–8.1)

## 2019-08-28 LAB — LACTATE DEHYDROGENASE: LDH: 134 U/L (ref 98–192)

## 2019-09-03 ENCOUNTER — Inpatient Hospital Stay (HOSPITAL_BASED_OUTPATIENT_CLINIC_OR_DEPARTMENT_OTHER): Payer: No Typology Code available for payment source | Admitting: Nurse Practitioner

## 2019-09-03 ENCOUNTER — Other Ambulatory Visit: Payer: Self-pay

## 2019-09-03 ENCOUNTER — Ambulatory Visit (HOSPITAL_COMMUNITY): Payer: PRIVATE HEALTH INSURANCE | Admitting: Nurse Practitioner

## 2019-09-03 DIAGNOSIS — C50912 Malignant neoplasm of unspecified site of left female breast: Secondary | ICD-10-CM | POA: Diagnosis not present

## 2019-09-03 DIAGNOSIS — C50312 Malignant neoplasm of lower-inner quadrant of left female breast: Secondary | ICD-10-CM | POA: Diagnosis not present

## 2019-09-03 DIAGNOSIS — Z170 Estrogen receptor positive status [ER+]: Secondary | ICD-10-CM | POA: Diagnosis not present

## 2019-09-03 NOTE — Assessment & Plan Note (Addendum)
1.  Stage IIb invasive ductal carcinoma the left breast: -Patient was diagnosed in May 2015.  ER positive/PR positive/HER-2 positive. -Treated at [facility] in [place]. -Initially treated with left lumpectomy by Dr. Arlester Marker on 08/20/2013. -Received adjuvant chemotherapy with Adriamycin/Cytoxan x4 cycles, followed by weekly Taxol x12. -Completed chemotherapy on 02/21/2014. Martin Majestic on to complete adjuvant breast radiation therapy on 05/21/2014. -Initially started on antiestrogen therapy with tamoxifen on 05/2014. -Remained on tamoxifen until she transferred her care to [facility] in 12/2015, when her antiestrogen therapy was transitioned to Arimidex daily on 12/2015. -BCI testing done on 07/19/2017 that showed high likelihood benefit of extending endocrine therapy.  Patient is advised to continue Arimidex for 10 years. -Diagnostic mammogram completed probable benign left breast lumpectomy calcifications which may represent developing fat necrosis.  B RADS 3 repeat mammogram in 6 months. -Labs done on 08/28/2019 were all WNL. -Next mammogram is due 09/18/2019. -We will see her back in 6 months with repeat labs  2.  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

## 2019-09-03 NOTE — Patient Instructions (Signed)
[facility] Discharge Instructions  Follow up in 6 months with labs    Thank you for choosing [facility] at [facility] to provide your oncology and hematology care.  To afford each patient quality time with our provider, please arrive at least 15 minutes before your scheduled appointment time.   If you have a lab appointment with the Cancer Center please come in thru the Main Entrance and check in at the main information desk.  You need to re-schedule your appointment should you arrive 10 or more minutes late.  We strive to give you quality time with our providers, and arriving late affects you and other patients whose appointments are after yours.  Also, if you no show three or more times for appointments you may be dismissed from the clinic at the providers discretion.     Again, thank you for choosing [facility].  Our hope is that these requests will decrease the amount of time that you wait before being seen by our physicians.       _____________________________________________________________  Should you have questions after your visit to [facility], please contact our office at [phone] between the hours of 8:00 a.m. and 4:30 p.m.  Voicemails left after 4:00 p.m. will not be returned until the following business day.  For prescription refill requests, have your pharmacy contact our office and allow 72 hours.    Due to Covid, you will need to wear a mask upon entering the hospital. If you do not have a mask, a mask will be given to you at the Main Entrance upon arrival. For doctor visits, patients may have 1 support person with them. For treatment visits, patients can not have anyone with them due to social distancing guidelines and our immunocompromised population.      

## 2019-09-03 NOTE — Progress Notes (Signed)
[place], [place] [address]   CLINIC:  Medical Oncology/Hematology  PCP:  Lavella Lemons, PA [place] [address] [phone]   REASON FOR VISIT: Follow-up for breast cancer   CURRENT THERAPY: Arimidex  BRIEF ONCOLOGIC HISTORY:  Oncology History  Malignant neoplasm of left female breast ([place])  07/30/2013 Mammogram   Mammogram- 5 cm from nipple, 1.6 x 1.2 x 1.0 cm spiculated, irregular mass in left breast at 11 o'clock position.   08/09/2013 Procedure   US- guided biopsy of left breast mass.   08/09/2013 Pathology Results   Invasive ductal carcinoma   08/20/2013 Procedure   Left lumpectomy and sentinel lymph node biopsy and modified axillary dissection by Dr. Anthony Sar.   08/20/2013 Pathology Results   Invasive tumor is 5 cm in largest dimension, grade 3, negative margins, 1/10 lymph node positive for micrometastasis and 1/10 lymph node positive for macro-metastasis, + LVI.  ER + > 90%, PR + > 90%, Ki-67 23%, HER2+ but ratio was 1.26.   09/27/2013 - 11/08/2013 Chemotherapy   AC x 4 cycles    12/06/2013 - 02/21/2014 Chemotherapy   Weekly Taxol x 12 cycles   04/02/2014 - 05/21/2014 Radiation Therapy   Dr. Pablo Ledger   05/27/2014 - 01/05/2016 Anti-estrogen oral therapy   Tamoxifen 20 mg daily.   01/05/2016 Adverse Reaction   Severe hot flashes.  She is 64 years old as of 01/04/2016.  She should be post-menopausal.   01/05/2016 -  Anti-estrogen oral therapy   Arimidex   02/02/2016 Imaging   Bone density- BMD as determined from Femur Neck Left is 0.927 g/cm2 with a T-Score of -0.8. This patient is considered normal according to [facility]) criteria.     CANCER STAGING: Cancer Staging Malignant neoplasm of left female breast [facility]) Staging form: Breast, AJCC 7th Edition - Clinical stage from 07/30/2013: Stage IIB (T2, N1, M0) - Signed by Baird Cancer, PA-C on 01/05/2016    INTERVAL HISTORY:   Ms. Novack 64 y.o. female returns for routine follow-up for breast cancer.  Patient reports she is doing well since her last visit.  She is taking her Arimidex as prescribed with no side effects.  Her hot flashes are controlled with Celexa. Denies any nausea, vomiting, or diarrhea. Denies any new pains. Had not noticed any recent bleeding such as epistaxis, hematuria or hematochezia. Denies recent chest pain on exertion, shortness of breath on minimal exertion, pre-syncopal episodes, or palpitations. Denies any numbness or tingling in hands or feet. Denies any recent fevers, infections, or recent hospitalizations. Patient reports appetite at 100% and energy level at 75%.  She is eating well maintain her weight at this time.    REVIEW OF SYSTEMS:  Review of Systems  All other systems reviewed and are negative.    PAST MEDICAL/SURGICAL HISTORY:  Past Medical History:  Diagnosis Date  . Breast cancer, left ([place]) 01/05/2016   breast  . Diabetes mellitus without complication ([place])   . Hyperlipidemia   . Hypertension 2015   Past Surgical History:  Procedure Laterality Date  . BREAST BIOPSY Right 1998  . BREAST LUMPECTOMY Left 2015   Left lumpectomy and sentinel lymph node biopsy and modified axillary dissection by Dr. Anthony Sar  . PORT-A-CATH REMOVAL    . TUBAL LIGATION       SOCIAL HISTORY:  Social History   Socioeconomic History  . Marital status: Divorced    Spouse name: Not on file  .  Number of children: 2  . Years of education: ged  . Highest education level: Not on file  Occupational History  . Occupation: dietician aid    Comment: [employer]  Tobacco Use  . Smoking status: Never Smoker  . Smokeless tobacco: Never Used  Vaping Use  . Vaping Use: Never used  Substance and Sexual Activity  . Alcohol use: No  . Drug use: No  . Sexual activity: Yes    Birth control/protection: Post-menopausal  Other Topics Concern  . Not on file  Social History Narrative    Lives alone   Two daughters are grown   Sometimes stays with sister [place]   Social Determinants of Health   Financial Resource Strain:   . Difficulty of Paying Living Expenses:   Food Insecurity:   . Worried About Charity fundraiser in the Last Year:   . Arboriculturist in the Last Year:   Transportation Needs:   . Film/video editor (Medical):   Marland Kitchen Lack of Transportation (Non-Medical):   Physical Activity:   . Days of Exercise per Week:   . Minutes of Exercise per Session:   Stress:   . Feeling of Stress :   Social Connections:   . Frequency of Communication with Friends and Family:   . Frequency of Social Gatherings with Friends and Family:   . Attends Religious Services:   . Active Member of Clubs or Organizations:   . Attends Archivist Meetings:   Marland Kitchen Marital Status:   Intimate Partner Violence:   . Fear of Current or Ex-Partner:   . Emotionally Abused:   Marland Kitchen Physically Abused:   . Sexually Abused:     FAMILY HISTORY:  Family History  Problem Relation Age of Onset  . Cancer Mother        breast  . COPD Father   . Emphysema Father   . Hypertension Sister   . Hyperlipidemia Sister   . Cancer Brother        lung  . Pulmonary embolism Maternal Aunt   . Diabetes Paternal Aunt   . Stroke Maternal Grandmother   . Stroke Maternal Grandfather   . Aneurysm Paternal Grandmother   . Stroke Paternal Grandfather   . Early death Brother 1       murder  . Hypertension Daughter   . Edema Daughter   . Colon cancer Neg Hx     CURRENT MEDICATIONS:  Outpatient Encounter Medications as of 09/03/2019  Medication Sig  . anastrozole (ARIMIDEX) 1 MG tablet Take 1 tablet (1 mg total) by mouth daily.  Marland Kitchen atorvastatin (LIPITOR) 20 MG tablet Take 1 tablet (20 mg total) by mouth daily.  . calcium-vitamin D (OSCAL WITH D) 500-200 MG-UNIT tablet Take 2 tablets by mouth daily with breakfast.  . Cholecalciferol (D3 DOTS) 50 MCG (2000 UT) TBDP Take 1 capsule by mouth daily.   . citalopram (CELEXA) 10 MG tablet Take 1 tablet (10 mg total) by mouth daily.  Marland Kitchen glimepiride (AMARYL) 1 MG tablet TAKE 1/2 TABLET BY MOUTH ONCE DAILY  . hydrochlorothiazide (MICROZIDE) 12.5 MG capsule Take 1 capsule (12.5 mg total) daily by mouth.  . lovastatin (MEVACOR) 40 MG tablet Take 40 mg by mouth daily.  Marland Kitchen nystatin (MYCOSTATIN/NYSTOP) powder Apply powder under breasts twice daily  . OS-CAL CALCIUM + D3 500-200 MG-UNIT TABS Take 2 tablets by mouth every morning.  . potassium chloride SA (K-DUR,KLOR-CON) 20 MEQ tablet Take 1 tablet (20 mEq total) by  mouth 2 (two) times daily. (Patient taking differently: Take 20 mEq by mouth daily. )  . zolpidem (AMBIEN) 10 MG tablet Take 0.5-1 tablets (5-10 mg total) by mouth at bedtime as needed for sleep. (Patient not taking: Reported on 09/03/2019)   No facility-administered encounter medications on file as of 09/03/2019.    ALLERGIES:  Allergies  Allergen Reactions  . Effexor [Venlafaxine] Nausea And Vomiting    GI intolerance only  . Hydrocodone Nausea And Vomiting    GI intolerance only     PHYSICAL EXAM:  ECOG Performance status: 1  Vitals:   09/03/19 1056  BP: (!) 133/94  Pulse: 84  Resp: 18  Temp: 98.7 F (37.1 C)  SpO2: 100%   Filed Weights   09/03/19 1056  Weight: 158 lb 14.4 oz (72.1 kg)   Physical Exam Constitutional:      Appearance: Normal appearance. She is normal weight.  Cardiovascular:     Rate and Rhythm: Normal rate and regular rhythm.     Heart sounds: Normal heart sounds.  Pulmonary:     Effort: Pulmonary effort is normal.     Breath sounds: Normal breath sounds.  Abdominal:     General: Bowel sounds are normal.     Palpations: Abdomen is soft.  Musculoskeletal:        General: Normal range of motion.  Skin:    General: Skin is warm.  Neurological:     Mental Status: She is alert and oriented to person, place, and time. Mental status is at baseline.  Psychiatric:        Mood and Affect: Mood  normal.        Behavior: Behavior normal.        Thought Content: Thought content normal.        Judgment: Judgment normal.      LABORATORY DATA:  I have reviewed the labs as listed.  CBC    Component Value Date/Time   WBC 6.8 08/28/2019 1429   RBC 4.33 08/28/2019 1429   HGB 12.3 08/28/2019 1429   HCT 37.6 08/28/2019 1429   PLT 200 08/28/2019 1429   MCV 86.8 08/28/2019 1429   MCH 28.4 08/28/2019 1429   MCHC 32.7 08/28/2019 1429   RDW 14.0 08/28/2019 1429   LYMPHSABS 3.0 08/28/2019 1429   MONOABS 0.4 08/28/2019 1429   EOSABS 0.2 08/28/2019 1429   BASOSABS 0.1 08/28/2019 1429   CMP Latest Ref Rng & Units 08/28/2019 02/27/2019 08/24/2018  Glucose 70 - 99 mg/dL 124(H) 79 129(H)  BUN 8 - 23 mg/dL _0 Creatinine 0.44 - 1.00 mg/dL 0.96 0.78 0.82  Sodium 135 - 145 mmol/L 139 141 141  Potassium 3.5 - 5.1 mmol/L 3.0(L) 3.7 3.2(L)  Chloride 98 - 111 mmol/L 102 107 105  CO2 22 - 32 mmol/L _1 Calcium 8.9 - 10.3 mg/dL 9.0 9.0 9.4  Total Protein 6.5 - 8.1 g/dL 7.1 7.3 7.3  Total Bilirubin 0.3 - 1.2 mg/dL 0.4 0.6 0.4  Alkaline Phos 38 - 126 U/L 77 81 100  AST 15 - 41 U/L _2 ALT 0 - 44 U/L _3 All questions were answered to patient's stated satisfaction. Encouraged patient to call with any new concerns or questions before his next visit to the cancer center and we can certain see him sooner, if needed.     ASSESSMENT & PLAN:  Malignant neoplasm of left female breast (HCC) 1.  Stage IIb  invasive ductal carcinoma the left breast: -Patient was diagnosed in May 2015.  ER positive/PR positive/HER-2 positive. -Treated at [facility] in [place]. -Initially treated with left lumpectomy by Dr. Arlester Marker on 08/20/2013. -Received adjuvant chemotherapy with Adriamycin/Cytoxan x4 cycles, followed by weekly Taxol x12. -Completed chemotherapy on 02/21/2014. Martin Majestic on to complete adjuvant breast radiation therapy on 05/21/2014. -Initially started on  antiestrogen therapy with tamoxifen on 05/2014. -Remained on tamoxifen until she transferred her care to [facility] in 12/2015, when her antiestrogen therapy was transitioned to Arimidex daily on 12/2015. -BCI testing done on 07/19/2017 that showed high likelihood benefit of extending endocrine therapy.  Patient is advised to continue Arimidex for 10 years. -Diagnostic mammogram completed probable benign left breast lumpectomy calcifications which may represent developing fat necrosis.  B RADS 3 repeat mammogram in 6 months. -Labs done on 08/28/2019 were all WNL. -Next mammogram is due 09/18/2019. -We will see her back in 6 months with repeat labs  2.  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     Orders placed this encounter:  Orders Placed This Encounter  Procedures  . Lactate dehydrogenase  . CBC with Differential/Platelet  . Comprehensive metabolic panel  . Vitamin B12  . VITAMIN D 25 Hydroxy (Vit-D Deficiency, Fractures)     Francene Finders, FNP-C [place] [phone]

## 2019-09-18 ENCOUNTER — Ambulatory Visit (HOSPITAL_COMMUNITY)
Admission: RE | Admit: 2019-09-18 | Discharge: 2019-09-18 | Disposition: A | Discharge status: Home / Self Care | Payer: No Typology Code available for payment source | Source: Ambulatory Visit | Attending: Nurse Practitioner | Admitting: Nurse Practitioner

## 2019-09-18 ENCOUNTER — Ambulatory Visit (HOSPITAL_COMMUNITY)
Admission: RE | Admit: 2019-09-18 | Discharge: 2019-09-18 | Disposition: A | Discharge status: Home / Self Care | Payer: No Typology Code available for payment source | Source: Ambulatory Visit | Attending: Hematology | Admitting: Hematology

## 2019-09-18 ENCOUNTER — Other Ambulatory Visit: Payer: Self-pay

## 2019-09-18 DIAGNOSIS — C50912 Malignant neoplasm of unspecified site of left female breast: Secondary | ICD-10-CM

## 2019-09-18 DIAGNOSIS — Z170 Estrogen receptor positive status [ER+]: Secondary | ICD-10-CM | POA: Diagnosis present

## 2019-11-01 ENCOUNTER — Other Ambulatory Visit (HOSPITAL_COMMUNITY): Payer: Self-pay | Admitting: *Deleted

## 2019-11-01 MED ORDER — CITALOPRAM HYDROBROMIDE 10 MG PO TABS: 10.0000 mg | ORAL_TABLET | Freq: Every day | ORAL | 3 refills | Status: DC

## 2020-02-04 ENCOUNTER — Ambulatory Visit (HOSPITAL_COMMUNITY): Payer: No Typology Code available for payment source | Admitting: Hematology

## 2020-02-21 ENCOUNTER — Other Ambulatory Visit (HOSPITAL_COMMUNITY): Payer: Self-pay

## 2020-02-21 DIAGNOSIS — C50312 Malignant neoplasm of lower-inner quadrant of left female breast: Secondary | ICD-10-CM

## 2020-02-21 DIAGNOSIS — Z170 Estrogen receptor positive status [ER+]: Secondary | ICD-10-CM

## 2020-02-22 ENCOUNTER — Other Ambulatory Visit: Payer: Self-pay

## 2020-02-22 ENCOUNTER — Inpatient Hospital Stay (HOSPITAL_COMMUNITY): Payer: No Typology Code available for payment source | Attending: Hematology and Oncology

## 2020-02-22 DIAGNOSIS — Z170 Estrogen receptor positive status [ER+]: Secondary | ICD-10-CM | POA: Diagnosis not present

## 2020-02-22 DIAGNOSIS — C50312 Malignant neoplasm of lower-inner quadrant of left female breast: Secondary | ICD-10-CM

## 2020-02-22 DIAGNOSIS — C50912 Malignant neoplasm of unspecified site of left female breast: Secondary | ICD-10-CM | POA: Diagnosis not present

## 2020-02-22 DIAGNOSIS — Z79899 Other long term (current) drug therapy: Secondary | ICD-10-CM | POA: Diagnosis not present

## 2020-02-22 DIAGNOSIS — Z79811 Long term (current) use of aromatase inhibitors: Secondary | ICD-10-CM | POA: Insufficient documentation

## 2020-02-22 LAB — COMPREHENSIVE METABOLIC PANEL
ALT: 14 U/L (ref 0–44)
AST: 17 U/L (ref 15–41)
Albumin: 3.7 g/dL (ref 3.5–5.0)
Alkaline Phosphatase: 71 U/L (ref 38–126)
Anion gap: 7 (ref 5–15)
BUN: 14 mg/dL (ref 8–23)
CO2: 27 mmol/L (ref 22–32)
Calcium: 9.1 mg/dL (ref 8.9–10.3)
Chloride: 104 mmol/L (ref 98–111)
Creatinine, Ser: 1.08 mg/dL — ABNORMAL HIGH (ref 0.44–1.00)
GFR, Estimated: 58 mL/min — ABNORMAL LOW (ref >60)
Glucose, Bld: 103 mg/dL — ABNORMAL HIGH (ref 70–99)
Potassium: 3.2 mmol/L — ABNORMAL LOW (ref 3.5–5.1)
Sodium: 138 mmol/L (ref 135–145)
Total Bilirubin: 0.5 mg/dL (ref 0.3–1.2)
Total Protein: 7.2 g/dL (ref 6.5–8.1)

## 2020-02-22 LAB — CBC WITH DIFFERENTIAL/PLATELET
Abs Immature Granulocytes: 0.01 10*3/uL (ref 0.00–0.07)
Basophils Absolute: 0.1 10*3/uL (ref 0.0–0.1)
Basophils Relative: 1 %
Eosinophils Absolute: 0.2 10*3/uL (ref 0.0–0.5)
Eosinophils Relative: 4 %
HCT: 40.2 % (ref 36.0–46.0)
Hemoglobin: 13.1 g/dL (ref 12.0–15.0)
Immature Granulocytes: 0 %
Lymphocytes Relative: 45 %
Lymphs Abs: 2.5 10*3/uL (ref 0.7–4.0)
MCH: 28.4 pg (ref 26.0–34.0)
MCHC: 32.6 g/dL (ref 30.0–36.0)
MCV: 87 fL (ref 80.0–100.0)
Monocytes Absolute: 0.4 10*3/uL (ref 0.1–1.0)
Monocytes Relative: 7 %
Neutro Abs: 2.4 10*3/uL (ref 1.7–7.7)
Neutrophils Relative %: 43 %
Platelets: 193 10*3/uL (ref 150–400)
RBC: 4.62 MIL/uL (ref 3.87–5.11)
RDW: 13.4 % (ref 11.5–15.5)
WBC: 5.7 10*3/uL (ref 4.0–10.5)
nRBC: 0 % (ref 0.0–0.2)

## 2020-02-22 LAB — VITAMIN B12: Vitamin B-12: 368 pg/mL (ref 180–914)

## 2020-02-22 LAB — VITAMIN D 25 HYDROXY (VIT D DEFICIENCY, FRACTURES): Vit D, 25-Hydroxy: 26.47 ng/mL — ABNORMAL LOW (ref 30–100)

## 2020-02-22 LAB — LACTATE DEHYDROGENASE: LDH: 106 U/L (ref 98–192)

## 2020-02-28 NOTE — Progress Notes (Signed)
Laura Moon, [place] [address]   CLINIC:  Medical Oncology/Hematology  PCP:  Laura Lemons, PA [place] [address] [phone]   REASON FOR VISIT: Follow-up for breast cancer   CURRENT THERAPY: Arimidex  BRIEF ONCOLOGIC HISTORY:  Oncology History  Malignant neoplasm of left female breast ([place])  07/30/2013 Mammogram   Mammogram- 5 cm from nipple, 1.6 x 1.2 x 1.0 cm spiculated, irregular mass in left breast at 11 o'clock position.   08/09/2013 Procedure   US- guided biopsy of left breast mass.   08/09/2013 Pathology Results   Invasive ductal carcinoma   08/20/2013 Procedure   Left lumpectomy and sentinel lymph node biopsy and modified axillary dissection by Dr. Anthony Moon.   08/20/2013 Pathology Results   Invasive tumor is 5 cm in largest dimension, grade 3, negative margins, 1/10 lymph node positive for micrometastasis and 1/10 lymph node positive for macro-metastasis, + LVI.  ER + > 90%, PR + > 90%, Ki-67 23%, HER2+ but ratio was 1.26.   09/27/2013 - 11/08/2013 Chemotherapy   AC x 4 cycles    12/06/2013 - 02/21/2014 Chemotherapy   Weekly Taxol x 12 cycles   04/02/2014 - 05/21/2014 Radiation Therapy   Dr. Pablo Moon   05/27/2014 - 01/05/2016 Anti-estrogen oral therapy   Tamoxifen 20 mg daily.   01/05/2016 Adverse Reaction   Severe hot flashes.  She is 64 years old as of 01/04/2016.  She should be post-menopausal.   01/05/2016 -  Anti-estrogen oral therapy   Arimidex   02/02/2016 Imaging   Bone density- BMD as determined from Femur Neck Left is 0.927 g/cm2 with a T-Score of -0.8. This patient is considered normal according to [facility]) criteria.     CANCER STAGING: Cancer Staging Malignant neoplasm of left female breast [facility] Staging form: Breast, AJCC 7th Edition - Clinical stage from 07/30/2013: Stage IIB (T2, N1, M0) - Signed by Baird Cancer, PA-C on 01/05/2016  INTERVAL HISTORY:    Laura Moon 64 y.o. female returns for routine follow-up for breast cancer.   Patient reports she is doing well since her last visit.  She is taking her Arimidex as prescribed with no side effects except hot flashes She didn't notice any breast lumps. She is however distressed about having to lift heavy weights at work She is unable to do this. When she lifts heavy weights, she notices swelling and pain in the left arm and shoulder, this has resolved now since she took off from work. Otherwise she is grieving loss of grandson. She is not taking ca and vit D, she couldn't get OSCAL she says.   REVIEW OF SYSTEMS:  Review of Systems  All other systems reviewed and are negative.    PAST MEDICAL/SURGICAL HISTORY:  Past Medical History:  Diagnosis Date  . Breast cancer, left ([place]) 01/05/2016   breast  . Diabetes mellitus without complication ([place])   . Hyperlipidemia   . Hypertension 2015   Past Surgical History:  Procedure Laterality Date  . BREAST BIOPSY Right 1998  . BREAST LUMPECTOMY Left 2015   Left lumpectomy and sentinel lymph node biopsy and modified axillary dissection by Dr. Anthony Moon  . PORT-A-CATH REMOVAL    . TUBAL LIGATION       SOCIAL HISTORY:  Social History   Socioeconomic History  . Marital status: Divorced    Spouse name: Not on file  . Number of children: 2  . Years of education:  ged  . Highest education level: Not on file  Occupational History  . Occupation: dietician aid    Comment: [employer]  Tobacco Use  . Smoking status: Never Smoker  . Smokeless tobacco: Never Used  Vaping Use  . Vaping Use: Never used  Substance and Sexual Activity  . Alcohol use: No  . Drug use: No  . Sexual activity: Yes    Birth control/protection: Post-menopausal  Other Topics Concern  . Not on file  Social History Narrative   Lives alone   Two daughters are grown   Sometimes stays with sister Laura Moon   Social Determinants of Health   Financial  Resource Strain: Not on file  Food Insecurity: Not on file  Transportation Needs: Not on file  Physical Activity: Not on file  Stress: Not on file  Social Connections: Not on file  Intimate Partner Violence: Not on file    FAMILY HISTORY:  Family History  Problem Relation Age of Onset  . Cancer Mother        breast  . COPD Father   . Emphysema Father   . Hypertension Sister   . Hyperlipidemia Sister   . Cancer Brother        lung  . Pulmonary embolism Maternal Aunt   . Diabetes Paternal Aunt   . Stroke Maternal Grandmother   . Stroke Maternal Grandfather   . Aneurysm Paternal Grandmother   . Stroke Paternal Grandfather   . Early death Brother 26       murder  . Hypertension Daughter   . Edema Daughter   . Colon cancer Neg Hx     CURRENT MEDICATIONS:  Outpatient Encounter Medications as of 02/29/2020  Medication Sig  . anastrozole (ARIMIDEX) 1 MG tablet Take 1 tablet (1 mg total) by mouth daily.  Marland Kitchen atorvastatin (LIPITOR) 20 MG tablet Take 1 tablet (20 mg total) by mouth daily.  . calcium-vitamin D (OSCAL WITH D) 500-200 MG-UNIT tablet Take 2 tablets by mouth daily with breakfast.  . Cholecalciferol (D3 DOTS) 50 MCG (2000 UT) TBDP Take 1 capsule by mouth daily.  . citalopram (CELEXA) 10 MG tablet Take 1 tablet (10 mg total) by mouth daily.  Marland Kitchen glimepiride (AMARYL) 1 MG tablet TAKE 1/2 TABLET BY MOUTH ONCE DAILY  . hydrochlorothiazide (MICROZIDE) 12.5 MG capsule Take 1 capsule (12.5 mg total) daily by mouth.  . lovastatin (MEVACOR) 40 MG tablet Take 40 mg by mouth daily.  Marland Kitchen nystatin (MYCOSTATIN/NYSTOP) powder Apply powder under breasts twice daily  . OS-CAL CALCIUM + D3 500-200 MG-UNIT TABS Take 2 tablets by mouth every morning.  . potassium chloride SA (K-DUR,KLOR-CON) 20 MEQ tablet Take 1 tablet (20 mEq total) by mouth 2 (two) times daily. (Patient taking differently: Take 20 mEq by mouth daily. )  . zolpidem (AMBIEN) 10 MG tablet Take 0.5-1 tablets (5-10 mg total) by  mouth at bedtime as needed for sleep. (Patient not taking: Reported on 09/03/2019)   No facility-administered encounter medications on file as of 02/29/2020.    ALLERGIES:  Allergies  Allergen Reactions  . Effexor [Venlafaxine] Nausea And Vomiting    GI intolerance only  . Hydrocodone Nausea And Vomiting    GI intolerance only     PHYSICAL EXAM:  ECOG Performance status: 1  There were no vitals filed for this visit. There were no vitals filed for this visit. Physical Exam Constitutional:      Appearance: Normal appearance. She is normal weight.  Cardiovascular:  Rate and Rhythm: Normal rate and regular rhythm.     Heart sounds: Normal heart sounds.  Pulmonary:     Effort: Pulmonary effort is normal.     Breath sounds: Normal breath sounds.  Abdominal:     General: Bowel sounds are normal.     Palpations: Abdomen is soft.  Musculoskeletal:        General: Normal range of motion.  Skin:    General: Skin is warm.  Neurological:     Mental Status: She is alert and oriented to person, place, and time. Mental status is at baseline.  Psychiatric:        Mood and Affect: Mood normal.        Behavior: Behavior normal.        Thought Content: Thought content normal.        Judgment: Judgment normal.   Breast exam: Left breast smaller than right breast No palpable masses in both breasts. Area of scarring/fat necrosis ntoed left breast No regional adenopathy   LABORATORY DATA:  I have reviewed the labs as listed.  CBC    Component Value Date/Time   WBC 5.7 02/22/2020 1029   RBC 4.62 02/22/2020 1029   HGB 13.1 02/22/2020 1029   HCT 40.2 02/22/2020 1029   PLT 193 02/22/2020 1029   MCV 87.0 02/22/2020 1029   MCH 28.4 02/22/2020 1029   MCHC 32.6 02/22/2020 1029   RDW 13.4 02/22/2020 1029   LYMPHSABS 2.5 02/22/2020 1029   MONOABS 0.4 02/22/2020 1029   EOSABS 0.2 02/22/2020 1029   BASOSABS 0.1 02/22/2020 1029   CMP Latest Ref Rng & Units 02/22/2020 08/28/2019  02/27/2019  Glucose 70 - 99 mg/dL 103(H) 124(H) 79  BUN 8 - 23 mg/dL $Remove'14 16 16  'PMIaxQe$ Creatinine 0.44 - 1.00 mg/dL 1.08(H) 0.96 0.78  Sodium 135 - 145 mmol/L 138 139 141  Potassium 3.5 - 5.1 mmol/L 3.2(L) 3.0(L) 3.7  Chloride 98 - 111 mmol/L 104 102 107  CO2 22 - 32 mmol/L $RemoveB'27 25 27  'PbDRkcIZ$ Calcium 8.9 - 10.3 mg/dL 9.1 9.0 9.0  Total Protein 6.5 - 8.1 g/dL 7.2 7.1 7.3  Total Bilirubin 0.3 - 1.2 mg/dL 0.5 0.4 0.6  Alkaline Phos 38 - 126 U/L 71 77 81  AST 15 - 41 U/L $Remo'17 19 17  'UKqwh$ ALT 0 - 44 U/L $Remo'14 19 19    'eJyIo$ All questions were answered to patient's stated satisfaction. Encouraged patient to call with any new concerns or questions before his next visit to the cancer center and we can certain see him sooner, if needed.     ASSESSMENT & PLAN:  No problem-specific Assessment & Plan notes found for this encounter.  1.  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  ER positive/PR positive/HER-2 positive. -Treated at [facility] in [place]. -Initially treated with left lumpectomy by Dr. Arlester Marker on 08/20/2013. -Received adjuvant chemotherapy with Adriamycin/Cytoxan x4 cycles, followed by weekly Taxol x12. -Completed chemotherapy on 02/21/2014. Martin Majestic on to complete adjuvant breast radiation therapy on 05/21/2014. -Initially started on antiestrogen therapy with tamoxifen on 05/2014. -Remained on tamoxifen until she transferred her care to [place] in 12/2015, when her antiestrogen therapy was transitioned to Arimidex daily on 12/2015. -BCI testing done on 07/19/2017 that showed high likelihood benefit of extending endocrine therapy.  Patient is advised to continue Arimidex for 10 years. -Last mammogram 09/2019, no evidence of malignancy, Recommend bilateral diagnostic mammography in 1  year with magnification views of the left breast to demonstrate 2 years of stability of the probably benign left breast calcifications ROS and PE today, no  concerns for recurrence Refilled arimidex Repeat mammo ordered for June 2022 Repeat bone density ordered.  2.  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   Orders placed this encounter:  No orders of the defined types were placed in this encounter.  Benay Pike MD

## 2020-02-29 ENCOUNTER — Encounter (HOSPITAL_COMMUNITY): Payer: Self-pay | Admitting: Hematology and Oncology

## 2020-02-29 ENCOUNTER — Encounter (HOSPITAL_COMMUNITY): Payer: Self-pay | Admitting: *Deleted

## 2020-02-29 ENCOUNTER — Inpatient Hospital Stay (HOSPITAL_BASED_OUTPATIENT_CLINIC_OR_DEPARTMENT_OTHER): Payer: No Typology Code available for payment source | Admitting: Hematology and Oncology

## 2020-02-29 ENCOUNTER — Other Ambulatory Visit: Payer: Self-pay

## 2020-02-29 VITALS — BP 157/97 | HR 85 | Temp 98.4°F | Resp 18 | Wt 154.4 lb

## 2020-02-29 DIAGNOSIS — C50212 Malignant neoplasm of upper-inner quadrant of left female breast: Secondary | ICD-10-CM | POA: Diagnosis not present

## 2020-02-29 DIAGNOSIS — C50312 Malignant neoplasm of lower-inner quadrant of left female breast: Secondary | ICD-10-CM

## 2020-02-29 DIAGNOSIS — Z170 Estrogen receptor positive status [ER+]: Secondary | ICD-10-CM | POA: Diagnosis not present

## 2020-02-29 DIAGNOSIS — C50912 Malignant neoplasm of unspecified site of left female breast: Secondary | ICD-10-CM | POA: Diagnosis not present

## 2020-02-29 MED ORDER — ANASTROZOLE 1 MG PO TABS: 1.0000 mg | ORAL_TABLET | Freq: Every day | ORAL | 4 refills | Status: DC

## 2020-03-10 ENCOUNTER — Ambulatory Visit (HOSPITAL_COMMUNITY)
Admission: RE | Admit: 2020-03-10 | Discharge: 2020-03-10 | Disposition: A | Discharge status: Home / Self Care | Payer: No Typology Code available for payment source | Source: Ambulatory Visit | Attending: Hematology and Oncology | Admitting: Hematology and Oncology

## 2020-03-10 ENCOUNTER — Other Ambulatory Visit: Payer: Self-pay

## 2020-03-10 DIAGNOSIS — C50312 Malignant neoplasm of lower-inner quadrant of left female breast: Secondary | ICD-10-CM | POA: Insufficient documentation

## 2020-03-10 DIAGNOSIS — Z170 Estrogen receptor positive status [ER+]: Secondary | ICD-10-CM | POA: Diagnosis present

## 2020-06-16 ENCOUNTER — Other Ambulatory Visit (HOSPITAL_COMMUNITY): Payer: Self-pay | Admitting: Surgery

## 2020-06-16 DIAGNOSIS — C50312 Malignant neoplasm of lower-inner quadrant of left female breast: Secondary | ICD-10-CM

## 2020-06-16 DIAGNOSIS — Z170 Estrogen receptor positive status [ER+]: Secondary | ICD-10-CM

## 2020-06-16 MED ORDER — CITALOPRAM HYDROBROMIDE 10 MG PO TABS: 10.0000 mg | ORAL_TABLET | Freq: Every day | ORAL | 3 refills | Status: DC

## 2020-08-14 ENCOUNTER — Other Ambulatory Visit (HOSPITAL_COMMUNITY): Payer: Self-pay | Admitting: Hematology and Oncology

## 2020-08-14 DIAGNOSIS — R921 Mammographic calcification found on diagnostic imaging of breast: Secondary | ICD-10-CM

## 2020-09-22 ENCOUNTER — Other Ambulatory Visit (HOSPITAL_COMMUNITY): Payer: Self-pay | Admitting: *Deleted

## 2020-09-22 DIAGNOSIS — C50312 Malignant neoplasm of lower-inner quadrant of left female breast: Secondary | ICD-10-CM

## 2020-09-22 DIAGNOSIS — Z170 Estrogen receptor positive status [ER+]: Secondary | ICD-10-CM

## 2020-09-23 ENCOUNTER — Ambulatory Visit (HOSPITAL_COMMUNITY)
Admission: RE | Admit: 2020-09-23 | Discharge: 2020-09-23 | Disposition: A | Discharge status: Home / Self Care | Payer: No Typology Code available for payment source | Source: Ambulatory Visit | Attending: Hematology and Oncology | Admitting: Hematology and Oncology

## 2020-09-23 ENCOUNTER — Other Ambulatory Visit: Payer: Self-pay

## 2020-09-23 ENCOUNTER — Ambulatory Visit (HOSPITAL_COMMUNITY): Payer: Self-pay

## 2020-09-23 ENCOUNTER — Encounter (HOSPITAL_COMMUNITY): Payer: No Typology Code available for payment source

## 2020-09-23 ENCOUNTER — Inpatient Hospital Stay (HOSPITAL_COMMUNITY): Payer: No Typology Code available for payment source | Attending: Hematology

## 2020-09-23 ENCOUNTER — Ambulatory Visit (HOSPITAL_COMMUNITY): Payer: No Typology Code available for payment source

## 2020-09-23 DIAGNOSIS — Z79899 Other long term (current) drug therapy: Secondary | ICD-10-CM | POA: Insufficient documentation

## 2020-09-23 DIAGNOSIS — R921 Mammographic calcification found on diagnostic imaging of breast: Secondary | ICD-10-CM | POA: Insufficient documentation

## 2020-09-23 DIAGNOSIS — C50312 Malignant neoplasm of lower-inner quadrant of left female breast: Secondary | ICD-10-CM | POA: Diagnosis not present

## 2020-09-23 DIAGNOSIS — Z170 Estrogen receptor positive status [ER+]: Secondary | ICD-10-CM | POA: Diagnosis present

## 2020-09-23 DIAGNOSIS — Z79811 Long term (current) use of aromatase inhibitors: Secondary | ICD-10-CM | POA: Insufficient documentation

## 2020-09-23 DIAGNOSIS — Z9221 Personal history of antineoplastic chemotherapy: Secondary | ICD-10-CM | POA: Insufficient documentation

## 2020-09-23 DIAGNOSIS — C50912 Malignant neoplasm of unspecified site of left female breast: Secondary | ICD-10-CM | POA: Insufficient documentation

## 2020-09-23 LAB — COMPREHENSIVE METABOLIC PANEL
ALT: 17 U/L (ref 0–44)
AST: 18 U/L (ref 15–41)
Albumin: 3.8 g/dL (ref 3.5–5.0)
Alkaline Phosphatase: 83 U/L (ref 38–126)
Anion gap: 7 (ref 5–15)
BUN: 17 mg/dL (ref 8–23)
CO2: 27 mmol/L (ref 22–32)
Calcium: 9.3 mg/dL (ref 8.9–10.3)
Chloride: 105 mmol/L (ref 98–111)
Creatinine, Ser: 0.78 mg/dL (ref 0.44–1.00)
GFR, Estimated: >60 mL/min (ref >60)
Glucose, Bld: 99 mg/dL (ref 70–99)
Potassium: 3.6 mmol/L (ref 3.5–5.1)
Sodium: 139 mmol/L (ref 135–145)
Total Bilirubin: 0.5 mg/dL (ref 0.3–1.2)
Total Protein: 7.1 g/dL (ref 6.5–8.1)

## 2020-09-23 LAB — LACTATE DEHYDROGENASE: LDH: 138 U/L (ref 98–192)

## 2020-09-23 LAB — CBC WITH DIFFERENTIAL/PLATELET
Abs Immature Granulocytes: 0.02 10*3/uL (ref 0.00–0.07)
Basophils Absolute: 0.1 10*3/uL (ref 0.0–0.1)
Basophils Relative: 1 %
Eosinophils Absolute: 0.2 10*3/uL (ref 0.0–0.5)
Eosinophils Relative: 3 %
HCT: 39.7 % (ref 36.0–46.0)
Hemoglobin: 13 g/dL (ref 12.0–15.0)
Immature Granulocytes: 0 %
Lymphocytes Relative: 35 %
Lymphs Abs: 2.8 10*3/uL (ref 0.7–4.0)
MCH: 28.2 pg (ref 26.0–34.0)
MCHC: 32.7 g/dL (ref 30.0–36.0)
MCV: 86.1 fL (ref 80.0–100.0)
Monocytes Absolute: 0.6 10*3/uL (ref 0.1–1.0)
Monocytes Relative: 7 %
Neutro Abs: 4.4 10*3/uL (ref 1.7–7.7)
Neutrophils Relative %: 54 %
Platelets: 195 10*3/uL (ref 150–400)
RBC: 4.61 MIL/uL (ref 3.87–5.11)
RDW: 13.7 % (ref 11.5–15.5)
WBC: 8 10*3/uL (ref 4.0–10.5)
nRBC: 0 % (ref 0.0–0.2)

## 2020-09-23 LAB — VITAMIN D 25 HYDROXY (VIT D DEFICIENCY, FRACTURES): Vit D, 25-Hydroxy: 26.53 ng/mL — ABNORMAL LOW (ref 30–100)

## 2020-09-23 LAB — VITAMIN B12: Vitamin B-12: 305 pg/mL (ref 180–914)

## 2020-09-30 ENCOUNTER — Other Ambulatory Visit: Payer: Self-pay

## 2020-09-30 ENCOUNTER — Inpatient Hospital Stay (HOSPITAL_BASED_OUTPATIENT_CLINIC_OR_DEPARTMENT_OTHER): Payer: No Typology Code available for payment source | Admitting: Hematology and Oncology

## 2020-09-30 VITALS — BP 151/80 | HR 74 | Temp 96.7°F | Resp 18 | Wt 155.3 lb

## 2020-09-30 DIAGNOSIS — C50312 Malignant neoplasm of lower-inner quadrant of left female breast: Secondary | ICD-10-CM

## 2020-09-30 DIAGNOSIS — Z170 Estrogen receptor positive status [ER+]: Secondary | ICD-10-CM | POA: Diagnosis not present

## 2020-09-30 DIAGNOSIS — Z79811 Long term (current) use of aromatase inhibitors: Secondary | ICD-10-CM | POA: Diagnosis not present

## 2020-09-30 DIAGNOSIS — Z9221 Personal history of antineoplastic chemotherapy: Secondary | ICD-10-CM | POA: Diagnosis not present

## 2020-09-30 DIAGNOSIS — C50912 Malignant neoplasm of unspecified site of left female breast: Secondary | ICD-10-CM | POA: Diagnosis present

## 2020-09-30 DIAGNOSIS — Z79899 Other long term (current) drug therapy: Secondary | ICD-10-CM | POA: Diagnosis not present

## 2020-09-30 NOTE — Progress Notes (Signed)
Laura Moon, [place] [address]   CLINIC:  Medical Oncology/Hematology  PCP:  Lavella Lemons, PA [place] [address] [phone]   REASON FOR VISIT: Follow-up for breast cancer   CURRENT THERAPY: Arimidex  BRIEF ONCOLOGIC HISTORY:  Oncology History  Malignant neoplasm of left female breast ([place]aura Moon)  07/30/2013 Mammogram   Mammogram- 5 cm from nipple, 1.6 x 1.2 x 1.0 cm spiculated, irregular mass in left breast at 11 o'clock position.   08/09/2013 Procedure   US- guided biopsy of left breast mass.   08/09/2013 Pathology Results   Invasive ductal carcinoma   08/20/2013 Procedure   Left lumpectomy and sentinel lymph node biopsy and modified axillary dissection by Dr. Anthony Sar.   08/20/2013 Pathology Results   Invasive tumor is 5 cm in largest dimension, grade 3, negative margins, 1/10 lymph node positive for micrometastasis and 1/10 lymph node positive for macro-metastasis, + LVI.  ER + > 90%, PR + > 90%, Ki-67 23%, HER2+ but ratio was 1.26.   09/27/2013 - 11/08/2013 Chemotherapy   AC x 4 cycles    12/06/2013 - 02/21/2014 Chemotherapy   Weekly Taxol x 12 cycles   04/02/2014 - 05/21/2014 Radiation Therapy   Dr. Pablo Ledger   05/27/2014 - 01/05/2016 Anti-estrogen oral therapy   Tamoxifen 20 mg daily.   01/05/2016 Adverse Reaction   Severe hot flashes.  She is 65 years old as of 01/04/2016.  She should be post-menopausal.   01/05/2016 -  Anti-estrogen oral therapy   Arimidex   02/02/2016 Imaging   Bone density- BMD as determined from Femur Neck Left is 0.927 g/cm2 with a T-Score of -0.8. This patient is considered normal according to [facility]) criteria.     CANCER STAGING: Cancer Staging Malignant neoplasm of left female breast [facility]) Staging form: Breast, AJCC 7th Edition - Clinical stage from 07/30/2013: Stage IIB (T2, N1, M0) - Signed by Baird Cancer, PA-C on 01/05/2016  INTERVAL HISTORY:    Laura Moon 64 y.o. female returns for routine follow-up for breast cancer.   On exam today Laura Moon notes has been well in the interim since her last visit.  She is tolerating the Arimidex therapy quite well other than having some hot flashes.  She notes some occasional tingling in her fingers otherwise denies any joint pain.  Her appetite and energy are both quite good.  She denies having any bumps or lumps in her axilla or breasts.  Mammogram performed last week shows no concerning abnormalities.  Otherwise she reports she feels quite well.  She denies any fevers, chills, sweats, nausea, vomiting or diarrhea.  A full 10 point ROS is listed below.  REVIEW OF SYSTEMS:  Review of Systems  All other systems reviewed and are negative.   PAST MEDICAL/SURGICAL HISTORY:  Past Medical History:  Diagnosis Date   Breast cancer, left ([place]) 01/05/2016   breast   Diabetes mellitus without complication ([place])    Hyperlipidemia    Hypertension 2015   Past Surgical History:  Procedure Laterality Date   BREAST BIOPSY Right 1998   BREAST LUMPECTOMY Left 2015   Left lumpectomy and sentinel lymph node biopsy and modified axillary dissection by Dr. Anthony Sar   PORT-A-CATH REMOVAL     TUBAL LIGATION       SOCIAL HISTORY:  Social History   Socioeconomic History   Marital status: Divorced    Spouse name: Not on file   Number of children: 2  Years of education: ged   Highest education level: Not on file  Occupational History   Occupation: dietician aid    Comment: [employer]  Tobacco Use   Smoking status: Never   Smokeless tobacco: Never  Vaping Use   Vaping Use: Never used  Substance and Sexual Activity   Alcohol use: No   Drug use: No   Sexual activity: Yes    Birth control/protection: Post-menopausal  Other Topics Concern   Not on file  Social History Narrative   Lives alone   Two daughters are grown   Sometimes stays with sister [place]   Social Determinants of  Health   Financial Resource Strain: Not on file  Food Insecurity: Not on file  Transportation Needs: Not on file  Physical Activity: Not on file  Stress: Not on file  Social Connections: Not on file  Intimate Partner Violence: Not on file    FAMILY HISTORY:  Family History  Problem Relation Age of Onset   Cancer Mother        breast   COPD Father    Emphysema Father    Hypertension Sister    Hyperlipidemia Sister    Cancer Brother        lung   Pulmonary embolism Maternal Aunt    Diabetes Paternal Aunt    Stroke Maternal Grandmother    Stroke Maternal Grandfather    Aneurysm Paternal Grandmother    Stroke Paternal Grandfather    Early death Brother 104       murder   Hypertension Daughter    Edema Daughter    Colon cancer Neg Hx     CURRENT MEDICATIONS:  Outpatient Encounter Medications as of 09/30/2020  Medication Sig   anastrozole (ARIMIDEX) 1 MG tablet Take 1 tablet (1 mg total) by mouth daily.   atorvastatin (LIPITOR) 20 MG tablet Take 1 tablet (20 mg total) by mouth daily.   calcium-vitamin D (OSCAL WITH D) 500-200 MG-UNIT tablet Take 2 tablets by mouth daily with breakfast.   Cholecalciferol (D3 DOTS) 50 MCG (2000 UT) TBDP Take 1 capsule by mouth daily.   citalopram (CELEXA) 10 MG tablet Take 1 tablet (10 mg total) by mouth daily.   glimepiride (AMARYL) 1 MG tablet TAKE 1/2 TABLET BY MOUTH ONCE DAILY   hydrochlorothiazide (MICROZIDE) 12.5 MG capsule Take 1 capsule (12.5 mg total) daily by mouth.   lovastatin (MEVACOR) 40 MG tablet Take 40 mg by mouth daily.   nystatin (MYCOSTATIN/NYSTOP) powder Apply powder under breasts twice daily   OS-CAL CALCIUM + D3 500-200 MG-UNIT TABS Take 2 tablets by mouth every morning.   potassium chloride SA (K-DUR,KLOR-CON) 20 MEQ tablet Take 1 tablet (20 mEq total) by mouth 2 (two) times daily. (Patient taking differently: Take 20 mEq by mouth daily.)   zolpidem (AMBIEN) 10 MG tablet Take 0.5-1 tablets (5-10 mg total) by mouth at  bedtime as needed for sleep. (Patient not taking: Reported on 09/03/2019)   No facility-administered encounter medications on file as of 09/30/2020.    ALLERGIES:  Allergies  Allergen Reactions   Effexor [Venlafaxine] Nausea And Vomiting    GI intolerance only   Hydrocodone Nausea And Vomiting    GI intolerance only     PHYSICAL EXAM:  ECOG Performance status: 1  There were no vitals filed for this visit. There were no vitals filed for this visit. Physical Exam Constitutional:      Appearance: Normal appearance. She is normal weight.  Cardiovascular:  Rate and Rhythm: Normal rate and regular rhythm.     Heart sounds: Normal heart sounds.  Pulmonary:     Effort: Pulmonary effort is normal.     Breath sounds: Normal breath sounds.  Abdominal:     General: Bowel sounds are normal.     Palpations: Abdomen is soft.  Musculoskeletal:        General: Normal range of motion.  Skin:    General: Skin is warm.  Neurological:     Mental Status: She is alert and oriented to person, place, and time. Mental status is at baseline.  Psychiatric:        Mood and Affect: Mood normal.        Behavior: Behavior normal.        Thought Content: Thought content normal.        Judgment: Judgment normal.  Breast exam: deferred due to recent mammogram   LABORATORY DATA:  I have reviewed the labs as listed.  CBC    Component Value Date/Time   WBC 8.0 09/23/2020 1400   RBC 4.61 09/23/2020 1400   HGB 13.0 09/23/2020 1400   HCT 39.7 09/23/2020 1400   PLT 195 09/23/2020 1400   MCV 86.1 09/23/2020 1400   MCH 28.2 09/23/2020 1400   MCHC 32.7 09/23/2020 1400   RDW 13.7 09/23/2020 1400   LYMPHSABS 2.8 09/23/2020 1400   MONOABS 0.6 09/23/2020 1400   EOSABS 0.2 09/23/2020 1400   BASOSABS 0.1 09/23/2020 1400   CMP Latest Ref Rng & Units 09/23/2020 02/22/2020 08/28/2019  Glucose 70 - 99 mg/dL 99 103(H) 124(H)  BUN 8 - 23 mg/dL _0 Creatinine 0.44 - 1.00 mg/dL 0.78 1.08(H) 0.96   Sodium 135 - 145 mmol/L 139 138 139  Potassium 3.5 - 5.1 mmol/L 3.6 3.2(L) 3.0(L)  Chloride 98 - 111 mmol/L 105 104 102  CO2 22 - 32 mmol/L _1 Calcium 8.9 - 10.3 mg/dL 9.3 9.1 9.0  Total Protein 6.5 - 8.1 g/dL 7.1 7.2 7.1  Total Bilirubin 0.3 - 1.2 mg/dL 0.5 0.5 0.4  Alkaline Phos 38 - 126 U/L 83 71 77  AST 15 - 41 U/L _2 ALT 0 - 44 U/L _3 All questions were answered to patient's stated satisfaction. Encouraged patient to call with any new concerns or questions before his next visit to the cancer center and we can certain see him sooner, if needed.     ASSESSMENT & PLAN:  No problem-specific Assessment & Plan notes found for this encounter.  1.  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  ER positive/PR positive/HER-2 positive. -Treated at [facility] in [place]. -Initially treated with left lumpectomy by Dr. Arlester Marker on 08/20/2013. -Received adjuvant chemotherapy with Adriamycin/Cytoxan x4 cycles, followed by weekly Taxol x12. -Completed chemotherapy on 02/21/2014. Laura Moon Majestic on to complete adjuvant breast radiation therapy on 05/21/2014. -Initially started on antiestrogen therapy with tamoxifen on 05/2014. -Remained on tamoxifen until she transferred her care to [place] in 12/2015, when her antiestrogen therapy was transitioned to Arimidex daily on 12/2015. -BCI testing done on 07/19/2017 that showed high likelihood benefit of extending endocrine therapy.  Patient is advised to continue Arimidex for 10 years. -Last mammogram 09/2020, no evidence of malignancy, Recommend bilateral diagnostic mammography in 1 year with magnification views of the left breast to demonstrate 2 years of stability of the probably benign  left breast calcifications ROS and PE today, no concerns for recurrence Recommend continued arimidex Repeat mammo to be ordered for July 2023 Last bone density scan  03/10/2020  2.  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   Orders placed this encounter:  No orders of the defined types were placed in this encounter.  Orson Slick MD

## 2020-10-06 ENCOUNTER — Other Ambulatory Visit (HOSPITAL_COMMUNITY): Payer: Self-pay

## 2020-10-06 DIAGNOSIS — C50312 Malignant neoplasm of lower-inner quadrant of left female breast: Secondary | ICD-10-CM

## 2020-10-06 DIAGNOSIS — Z170 Estrogen receptor positive status [ER+]: Secondary | ICD-10-CM

## 2020-10-06 DIAGNOSIS — C50912 Malignant neoplasm of unspecified site of left female breast: Secondary | ICD-10-CM

## 2021-01-07 ENCOUNTER — Other Ambulatory Visit: Payer: Self-pay | Admitting: *Deleted

## 2021-01-07 DIAGNOSIS — I8393 Asymptomatic varicose veins of bilateral lower extremities: Secondary | ICD-10-CM

## 2021-01-12 ENCOUNTER — Encounter: Payer: Self-pay | Admitting: Surgery

## 2021-01-12 ENCOUNTER — Other Ambulatory Visit: Payer: Self-pay

## 2021-01-12 ENCOUNTER — Ambulatory Visit (HOSPITAL_COMMUNITY)
Admission: RE | Admit: 2021-01-12 | Discharge: 2021-01-12 | Disposition: A | Discharge status: Home / Self Care | Payer: 59 | Source: Ambulatory Visit | Attending: Surgery | Admitting: Surgery

## 2021-01-12 ENCOUNTER — Ambulatory Visit (INDEPENDENT_AMBULATORY_CARE_PROVIDER_SITE_OTHER): Payer: 59 | Admitting: Surgery

## 2021-01-12 VITALS — BP 164/100 | HR 76 | Temp 98.2°F | Resp 20 | Ht 63.0 in | Wt 154.0 lb

## 2021-01-12 DIAGNOSIS — I8393 Asymptomatic varicose veins of bilateral lower extremities: Secondary | ICD-10-CM | POA: Diagnosis present

## 2021-01-12 DIAGNOSIS — I83893 Varicose veins of bilateral lower extremities with other complications: Secondary | ICD-10-CM

## 2021-01-12 NOTE — Progress Notes (Signed)
[facility] [place]  Patient name: Laura Moon DOB: [birth year] Sex: female   REQUESTING PROVIDER:    Aggie Hacker   REASON FOR CONSULT:    Painful varicose veins  HISTORY OF PRESENT ILLNESS:   Laura Moon is a 65 y.o. female, who is referred for evaluation of her varicose veins.  She states that she has been having pain and irritation and her varicose veins, right greater than left for 2 months.  She has had swelling for as long as she can remember.  Her legs ache particularly after walking and at the end of the day.  Elevating her legs does improve her symptoms.  She does not wear compression stockings.  She denies any history of DVT.  The patient has a history of breast cancer.  She is a diabetic.  She is a non-smoker.  She is medically managed for hypertension.  She takes a statin for hypercholesterolemia.  PAST MEDICAL HISTORY    Past Medical History:  Diagnosis Date   Breast cancer, left ([place]) 01/05/2016   breast   Diabetes mellitus without complication ([place])    Hyperlipidemia    Hypertension 2015     FAMILY HISTORY   Family History  Problem Relation Age of Onset   Cancer Mother        breast   COPD Father    Emphysema Father    Hypertension Sister    Hyperlipidemia Sister    Cancer Brother        lung   Pulmonary embolism Maternal Aunt    Diabetes Paternal Aunt    Stroke Maternal Grandmother    Stroke Maternal Grandfather    Aneurysm Paternal Grandmother    Stroke Paternal Grandfather    Early death Brother 35       murder   Hypertension Daughter    Edema Daughter    Colon cancer Neg Hx     SOCIAL HISTORY:   Social History   Socioeconomic History   Marital status: Divorced    Spouse name: Not on file   Number of children: 2   Years of education: ged   Highest education level: Not on file  Occupational History   Occupation: Automotive engineer aid    Comment: [employer]  Tobacco Use   Smoking status: Never   Smokeless tobacco: Never  Vaping Use   Vaping Use: Never used  Substance and Sexual Activity   Alcohol use: No   Drug use: No   Sexual activity: Yes    Birth control/protection: Post-menopausal  Other Topics Concern   Not on file  Social History Narrative   Lives alone   Two daughters are grown   Sometimes stays with sister Laura Moon   Social Determinants of Health   Financial Resource Strain: Not on file  Food Insecurity: Not on file  Transportation Needs: Not on file  Physical Activity: Not on file  Stress: Not on file  Social Connections: Not on file  Intimate Partner Violence: Not on file    ALLERGIES:    Allergies  Allergen Reactions   Effexor [Venlafaxine] Nausea And Vomiting    GI intolerance only   Hydrocodone Nausea And Vomiting    GI intolerance only    CURRENT MEDICATIONS:    Current Outpatient Medications  Medication Sig Dispense Refill   anastrozole (ARIMIDEX) 1 MG tablet Take 1 tablet (1 mg total) by mouth daily. 90 tablet 4   atorvastatin (LIPITOR) 20 MG tablet  Take 1 tablet (20 mg total) by mouth daily. 90 tablet 3   Cholecalciferol (D3 DOTS) 50 MCG (2000 UT) TBDP Take 1 capsule by mouth daily. 30 tablet 12   citalopram (CELEXA) 10 MG tablet Take 1 tablet (10 mg total) by mouth daily. 90 tablet 3   glimepiride (AMARYL) 1 MG tablet TAKE 1/2 TABLET BY MOUTH ONCE DAILY 45 tablet 3   hydrochlorothiazide (MICROZIDE) 12.5 MG capsule Take 1 capsule (12.5 mg total) daily by mouth. 90 capsule 3   lovastatin (MEVACOR) 40 MG tablet Take 40 mg by mouth daily.     nystatin (MYCOSTATIN/NYSTOP) powder Apply powder under breasts twice daily 30 g 6   OS-CAL CALCIUM + D3 500-200 MG-UNIT TABS Take 2 tablets by mouth every morning.     potassium chloride SA (K-DUR,KLOR-CON) 20 MEQ tablet Take 1 tablet (20 mEq total) by mouth 2 (two) times daily. (Patient taking differently: Take 20 mEq by mouth daily.) 180 tablet 1    calcium-vitamin D (OSCAL WITH D) 500-200 MG-UNIT tablet Take 2 tablets by mouth daily with breakfast. (Patient not taking: Reported on 01/12/2021) 60 tablet 12   zolpidem (AMBIEN) 10 MG tablet Take 0.5-1 tablets (5-10 mg total) by mouth at bedtime as needed for sleep. (Patient not taking: Reported on 09/03/2019) 30 tablet 1   No current facility-administered medications for this visit.    REVIEW OF SYSTEMS:   [X]  denotes positive finding, [ ]  denotes negative finding Cardiac  Comments:  Chest pain or chest pressure:    Shortness of breath upon exertion:    Short of breath when lying flat:    Irregular heart rhythm:        Vascular    Pain in calf, thigh, or hip brought on by ambulation:    Pain in feet at night that wakes you up from your sleep:     Blood clot in your veins:    Leg swelling:  x       Pulmonary    Oxygen at home:    Productive cough:     Wheezing:         Neurologic    Sudden weakness in arms or legs:     Sudden numbness in arms or legs:     Sudden onset of difficulty speaking or slurred speech:    Temporary loss of vision in one eye:     Problems with dizziness:         Gastrointestinal    Blood in stool:      Vomited blood:         Genitourinary    Burning when urinating:     Blood in urine:        Psychiatric    Major depression:         Hematologic    Bleeding problems:    Problems with blood clotting too easily:        Skin    Rashes or ulcers:        Constitutional    Fever or chills:     PHYSICAL EXAM:   Vitals:   01/12/21 0932  BP: (!) 164/100  Pulse: 76  Resp: 20  Temp: 98.2 F (36.8 C)  SpO2: 96%  Weight: 154 lb (69.9 kg)  Height: 5\' 3"  (1.6 m)    GENERAL: The patient is a well-nourished female, in no acute distress. The vital signs are documented above. CARDIAC: There is a regular rate and rhythm.  VASCULAR: Palpable pedal pulses.  1+ pitting edema bilaterally.  Multiple varicosities.  I evaluated her right saphenous  vein with ultrasound.  There are multiple branches but the vein is straight and of adequate diameter. PULMONARY: Nonlabored respirations ABDOMEN: Soft and non-tender with normal pitched bowel sounds.  MUSCULOSKELETAL: There are no major deformities or cyanosis. NEUROLOGIC: No focal weakness or paresthesias are detected. SKIN: See photos below PSYCHIATRIC: The patient has a normal affect.     STUDIES:   I have reviewed the  following: Venous Reflux Times  +--------------+---------+------+-----------+------------+--------+  RIGHT         Reflux NoRefluxReflux TimeDiameter cmsComments                          Yes                                   +--------------+---------+------+-----------+------------+--------+  CFV                     yes   >1 second                       +--------------+---------+------+-----------+------------+--------+  FV mid        no                                              +--------------+---------+------+-----------+------------+--------+  Popliteal               yes   >1 second                       +--------------+---------+------+-----------+------------+--------+  GSV at SFJ              yes    >500 ms      1.51              +--------------+---------+------+-----------+------------+--------+  GSV prox thigh          yes    >500 ms     0.918    tortuous  +--------------+---------+------+-----------+------------+--------+  GSV mid thigh           yes    >500 ms     0.899              +--------------+---------+------+-----------+------------+--------+  GSV dist thigh          yes    >500 ms      1.16              +--------------+---------+------+-----------+------------+--------+  GSV at knee             yes    >500 ms      0.82              +--------------+---------+------+-----------+------------+--------+  GSV prox calf           yes    >500 ms      1.05               +--------------+---------+------+-----------+------------+--------+  SSV Pop Fossa           yes    >500 ms     0.333              +--------------+---------+------+-----------+------------+--------+  SSV prox calf no  0.279              +--------------+---------+------+-----------+------------+--------+  SSV mid calf  no                           0.283              +--------------+---------+------+-----------+------------+--------+           Summary:  Right:  - No evidence of deep vein thrombosis seen in the right lower extremity,  from the common femoral through the popliteal veins.  - No evidence of superficial venous thrombosis in the right lower  extremity.     -Deep vein reflux in the CFV and popliteal vein.  - Superficial vein refux in the proximal SSV, SFJ, and GSV.  - The proximal GSV is slightly tortuous.   ASSESSMENT and PLAN   Bilateral painful varicose veins with leg swelling mild hyperpigmentation (CEAP Class 4): I discussed that we would initially try to treat her symptoms with leg elevation and compression.  She will be brought back in 3 months for a follow-up evaluation.  If she has not had adequate relief, I think she would be a good candidate for endovenous laser ablation and stab phlebectomy of her varicosities.   Leia Alf, MD, FACS [facility] [phone] Pager [phone]

## 2021-01-14 ENCOUNTER — Other Ambulatory Visit: Payer: Self-pay

## 2021-01-14 DIAGNOSIS — I83893 Varicose veins of bilateral lower extremities with other complications: Secondary | ICD-10-CM

## 2021-01-30 IMAGING — MG DIGITAL DIAGNOSTIC BILAT W/ TOMO W/ CAD
8 of 11 series · 8 of 27 positions shown · non-contrast
Comparison: Previous exams.

CLINICAL DATA: Short-term follow-up for probably benign
calcifications at the left breast lumpectomy site. History of left
breast cancer post lumpectomy 7799. Also history of prior benign
right breast excision in 0779.

EXAM:
DIGITAL DIAGNOSTIC BILATERAL MAMMOGRAM WITH TOMO AND CAD

[L ML (1 of 2)]
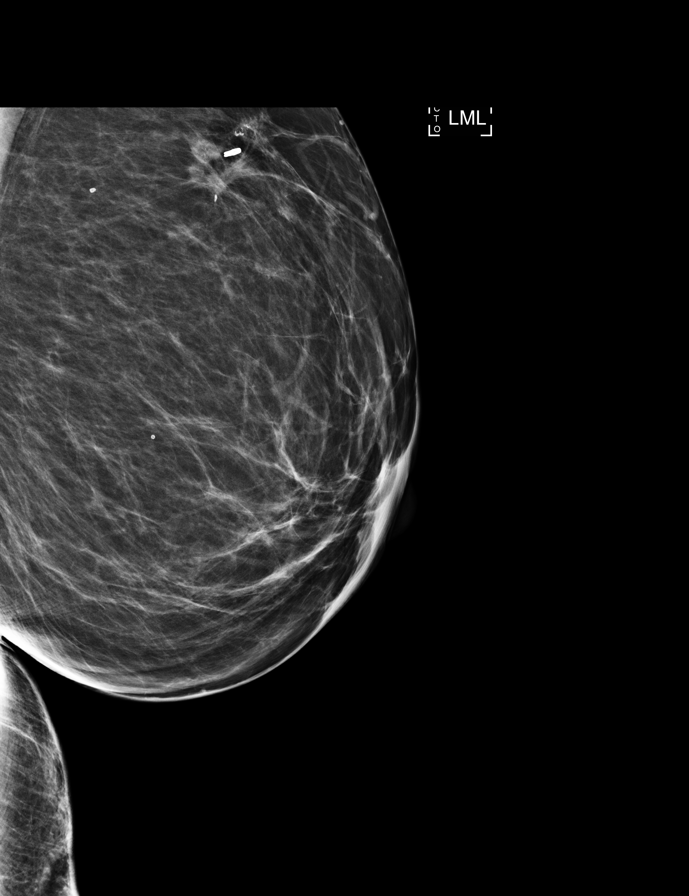

[L CC]
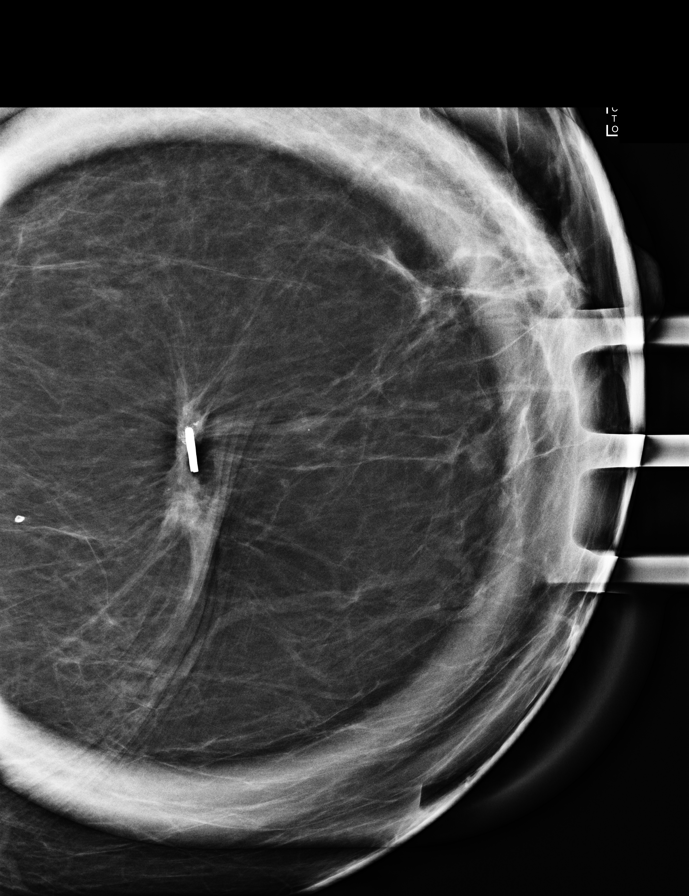

[L ML (2 of 2)]
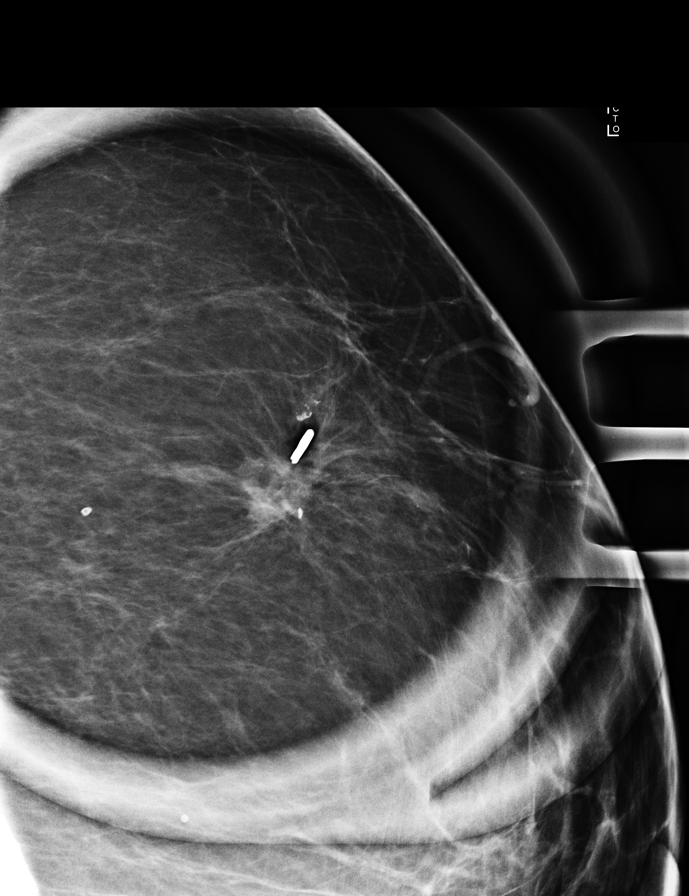

[R MLO synth-2D]
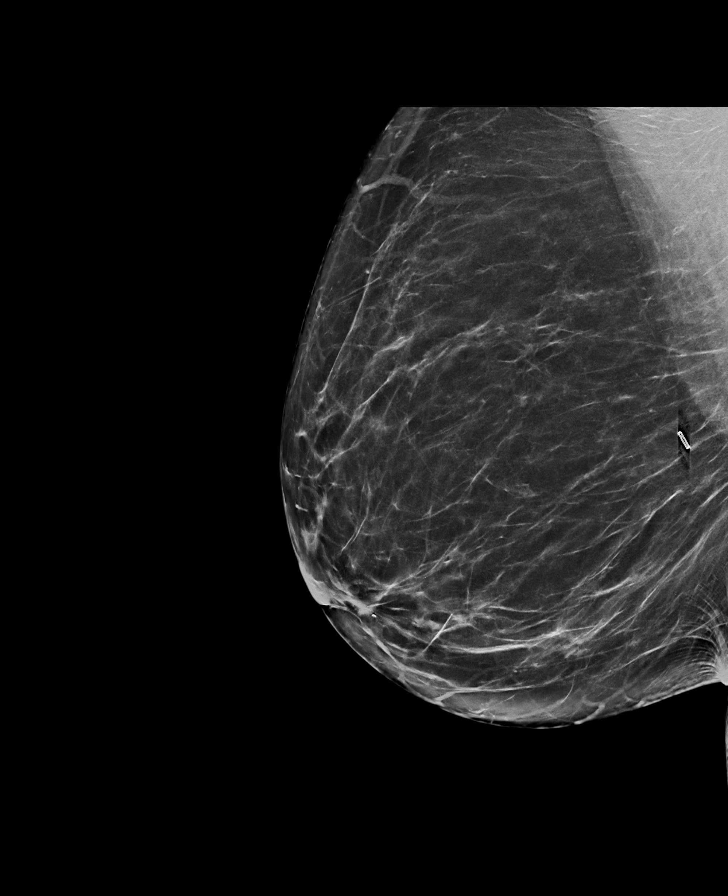

[L MLO synth-2D]
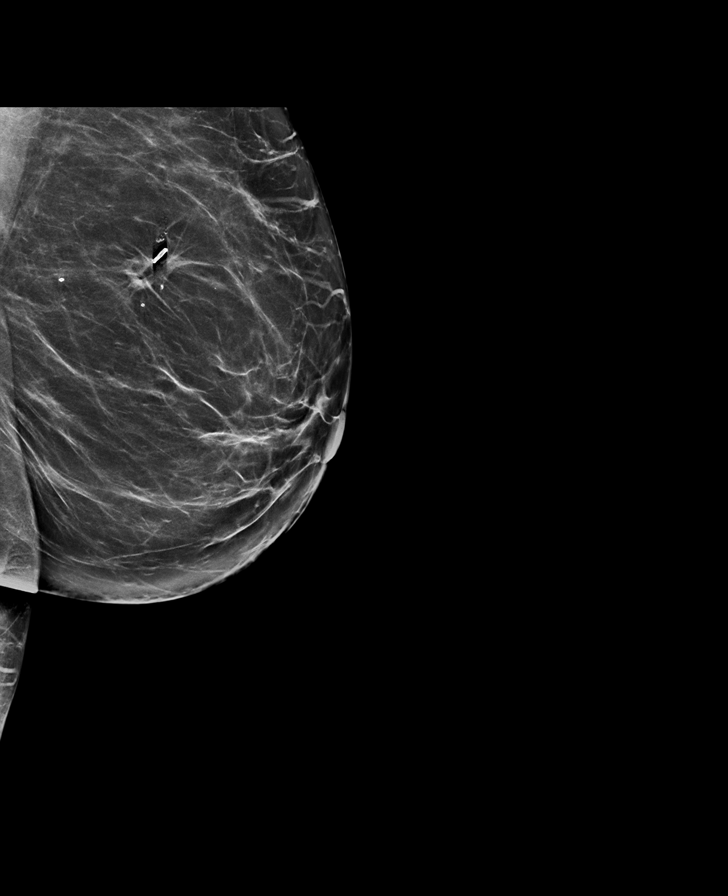

[L CC synth-2D]
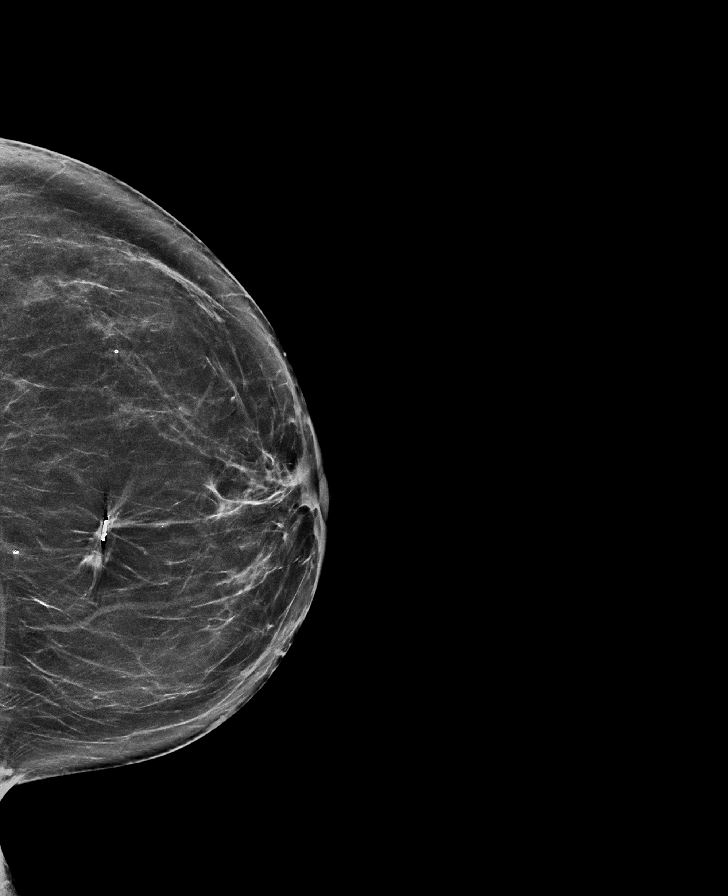

[R CC synth-2D]
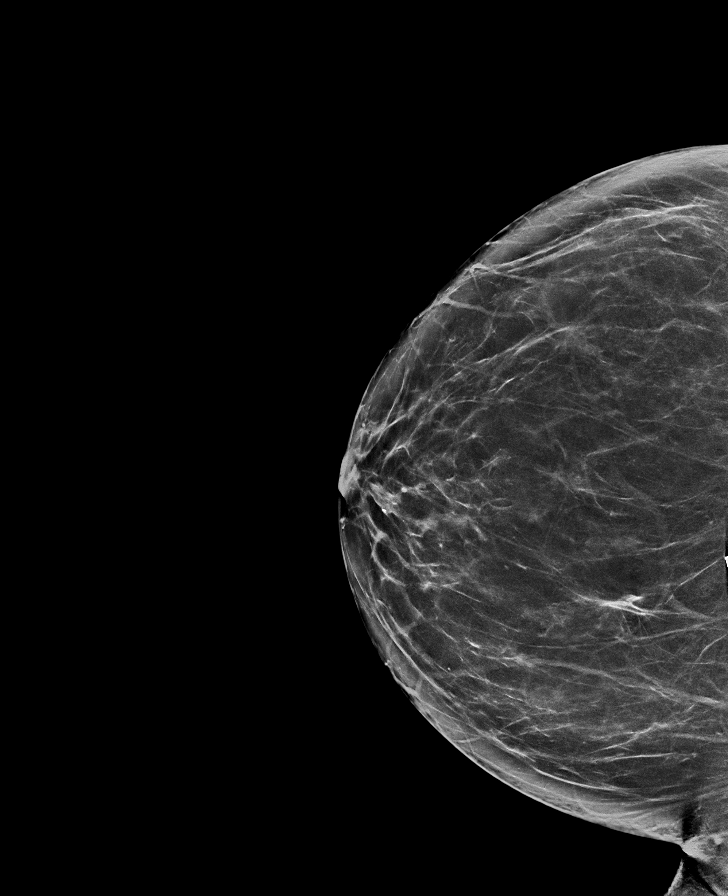

[R CC tomo · tomo slice 38/75.0]
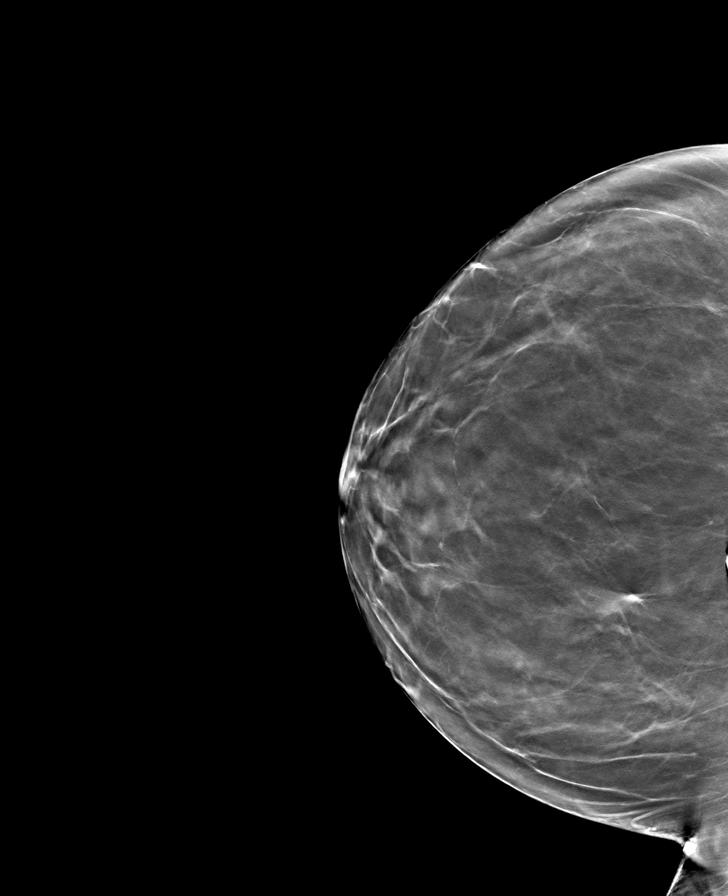

[8 of 27 positions shown; findings below may reference images not displayed]

ACR Breast Density Category b: There are scattered areas of
fibroglandular density.
FINDINGS: No suspicious masses or calcifications are seen in either breast.
Spot compression magnification views of the lumpectomy site in the
upper slightly inner left breast were performed demonstrating stable
appearance of coarse dystrophic type calcifications adjacent to a
surgical clip.

Mammographic images were processed with CAD.
IMPRESSION: 1. Stable probably benign likely dystrophic calcifications at the
lumpectomy site in the upper slightly inner left breast.

2.  No mammographic evidence of malignancy in either breast.

RECOMMENDATION:
Recommend bilateral diagnostic mammography in 1 year with
magnification views of the left breast to demonstrate 2 years of
stability of the probably benign left breast calcifications.

I have discussed the findings and recommendations with the patient.
If applicable, a reminder letter will be sent to the patient
regarding the next appointment.

BI-RADS CATEGORY  3: Probably benign.

## 2021-03-23 ENCOUNTER — Other Ambulatory Visit (HOSPITAL_COMMUNITY): Payer: Self-pay | Admitting: *Deleted

## 2021-03-23 DIAGNOSIS — Z170 Estrogen receptor positive status [ER+]: Secondary | ICD-10-CM

## 2021-03-23 DIAGNOSIS — C50212 Malignant neoplasm of upper-inner quadrant of left female breast: Secondary | ICD-10-CM

## 2021-03-23 MED ORDER — ANASTROZOLE 1 MG PO TABS: 1.0000 mg | ORAL_TABLET | Freq: Every day | ORAL | 4 refills | Status: AC

## 2021-03-23 NOTE — Telephone Encounter (Signed)
Per Dr Libby Maw OVN 09/2020, patient is to continue on Arimidex.  Script sent to [facility] in [place] per patient request.

## 2021-04-02 ENCOUNTER — Other Ambulatory Visit: Payer: Self-pay

## 2021-04-02 ENCOUNTER — Inpatient Hospital Stay (HOSPITAL_COMMUNITY): Payer: 59 | Attending: Hematology

## 2021-04-02 DIAGNOSIS — Z79899 Other long term (current) drug therapy: Secondary | ICD-10-CM | POA: Diagnosis not present

## 2021-04-02 DIAGNOSIS — Z7984 Long term (current) use of oral hypoglycemic drugs: Secondary | ICD-10-CM | POA: Diagnosis not present

## 2021-04-02 DIAGNOSIS — R232 Flushing: Secondary | ICD-10-CM | POA: Diagnosis not present

## 2021-04-02 DIAGNOSIS — R110 Nausea: Secondary | ICD-10-CM | POA: Insufficient documentation

## 2021-04-02 DIAGNOSIS — Z170 Estrogen receptor positive status [ER+]: Secondary | ICD-10-CM | POA: Insufficient documentation

## 2021-04-02 DIAGNOSIS — C50912 Malignant neoplasm of unspecified site of left female breast: Secondary | ICD-10-CM

## 2021-04-02 DIAGNOSIS — Z79811 Long term (current) use of aromatase inhibitors: Secondary | ICD-10-CM | POA: Insufficient documentation

## 2021-04-02 DIAGNOSIS — E559 Vitamin D deficiency, unspecified: Secondary | ICD-10-CM | POA: Insufficient documentation

## 2021-04-02 DIAGNOSIS — Z923 Personal history of irradiation: Secondary | ICD-10-CM | POA: Insufficient documentation

## 2021-04-02 DIAGNOSIS — M8580 Other specified disorders of bone density and structure, unspecified site: Secondary | ICD-10-CM | POA: Insufficient documentation

## 2021-04-02 DIAGNOSIS — C50312 Malignant neoplasm of lower-inner quadrant of left female breast: Secondary | ICD-10-CM

## 2021-04-02 DIAGNOSIS — C50212 Malignant neoplasm of upper-inner quadrant of left female breast: Secondary | ICD-10-CM | POA: Insufficient documentation

## 2021-04-02 LAB — CBC WITH DIFFERENTIAL/PLATELET
Abs Immature Granulocytes: 0.02 10*3/uL (ref 0.00–0.07)
Basophils Absolute: 0.1 10*3/uL (ref 0.0–0.1)
Basophils Relative: 1 %
Eosinophils Absolute: 0.2 10*3/uL (ref 0.0–0.5)
Eosinophils Relative: 3 %
HCT: 39.6 % (ref 36.0–46.0)
Hemoglobin: 13 g/dL (ref 12.0–15.0)
Immature Granulocytes: 0 %
Lymphocytes Relative: 49 %
Lymphs Abs: 2.9 10*3/uL (ref 0.7–4.0)
MCH: 27.8 pg (ref 26.0–34.0)
MCHC: 32.8 g/dL (ref 30.0–36.0)
MCV: 84.8 fL (ref 80.0–100.0)
Monocytes Absolute: 0.4 10*3/uL (ref 0.1–1.0)
Monocytes Relative: 6 %
Neutro Abs: 2.5 10*3/uL (ref 1.7–7.7)
Neutrophils Relative %: 41 %
Platelets: 196 10*3/uL (ref 150–400)
RBC: 4.67 MIL/uL (ref 3.87–5.11)
RDW: 13.7 % (ref 11.5–15.5)
WBC: 6.1 10*3/uL (ref 4.0–10.5)
nRBC: 0 % (ref 0.0–0.2)

## 2021-04-02 LAB — COMPREHENSIVE METABOLIC PANEL
ALT: 15 U/L (ref 0–44)
AST: 14 U/L — ABNORMAL LOW (ref 15–41)
Albumin: 3.7 g/dL (ref 3.5–5.0)
Alkaline Phosphatase: 87 U/L (ref 38–126)
Anion gap: 8 (ref 5–15)
BUN: 14 mg/dL (ref 8–23)
CO2: 26 mmol/L (ref 22–32)
Calcium: 8.9 mg/dL (ref 8.9–10.3)
Chloride: 104 mmol/L (ref 98–111)
Creatinine, Ser: 0.82 mg/dL (ref 0.44–1.00)
GFR, Estimated: >60 mL/min (ref >60)
Glucose, Bld: 207 mg/dL — ABNORMAL HIGH (ref 70–99)
Potassium: 3.3 mmol/L — ABNORMAL LOW (ref 3.5–5.1)
Sodium: 138 mmol/L (ref 135–145)
Total Bilirubin: 0.8 mg/dL (ref 0.3–1.2)
Total Protein: 7.1 g/dL (ref 6.5–8.1)

## 2021-04-02 LAB — VITAMIN D 25 HYDROXY (VIT D DEFICIENCY, FRACTURES): Vit D, 25-Hydroxy: 19.61 ng/mL — ABNORMAL LOW (ref 30–100)

## 2021-04-02 LAB — VITAMIN B12: Vitamin B-12: 417 pg/mL (ref 180–914)

## 2021-04-02 LAB — LACTATE DEHYDROGENASE: LDH: 107 U/L (ref 98–192)

## 2021-04-06 ENCOUNTER — Ambulatory Visit: Payer: 59 | Admitting: Surgery

## 2021-04-06 ENCOUNTER — Inpatient Hospital Stay (HOSPITAL_COMMUNITY): Admission: RE | Admit: 2021-04-06 | Payer: 59 | Source: Ambulatory Visit

## 2021-04-06 ENCOUNTER — Encounter (HOSPITAL_COMMUNITY): Payer: 59

## 2021-04-09 ENCOUNTER — Inpatient Hospital Stay (HOSPITAL_COMMUNITY): Payer: 59 | Admitting: Hematology

## 2021-04-09 ENCOUNTER — Other Ambulatory Visit (HOSPITAL_COMMUNITY): Payer: Self-pay | Admitting: Hematology

## 2021-04-09 ENCOUNTER — Other Ambulatory Visit: Payer: Self-pay

## 2021-04-09 VITALS — BP 157/95 | HR 86 | Temp 96.8°F | Resp 18 | Ht 62.99 in | Wt 162.5 lb

## 2021-04-09 DIAGNOSIS — Z79899 Other long term (current) drug therapy: Secondary | ICD-10-CM | POA: Diagnosis not present

## 2021-04-09 DIAGNOSIS — Z923 Personal history of irradiation: Secondary | ICD-10-CM | POA: Diagnosis not present

## 2021-04-09 DIAGNOSIS — C50312 Malignant neoplasm of lower-inner quadrant of left female breast: Secondary | ICD-10-CM

## 2021-04-09 DIAGNOSIS — E559 Vitamin D deficiency, unspecified: Secondary | ICD-10-CM | POA: Diagnosis not present

## 2021-04-09 DIAGNOSIS — R110 Nausea: Secondary | ICD-10-CM | POA: Diagnosis not present

## 2021-04-09 DIAGNOSIS — C50212 Malignant neoplasm of upper-inner quadrant of left female breast: Secondary | ICD-10-CM | POA: Diagnosis not present

## 2021-04-09 DIAGNOSIS — Z79811 Long term (current) use of aromatase inhibitors: Secondary | ICD-10-CM | POA: Diagnosis not present

## 2021-04-09 DIAGNOSIS — Z7984 Long term (current) use of oral hypoglycemic drugs: Secondary | ICD-10-CM | POA: Diagnosis not present

## 2021-04-09 DIAGNOSIS — M8580 Other specified disorders of bone density and structure, unspecified site: Secondary | ICD-10-CM | POA: Diagnosis not present

## 2021-04-09 DIAGNOSIS — Z170 Estrogen receptor positive status [ER+]: Secondary | ICD-10-CM | POA: Diagnosis not present

## 2021-04-09 DIAGNOSIS — R232 Flushing: Secondary | ICD-10-CM | POA: Diagnosis not present

## 2021-04-09 DIAGNOSIS — Z1231 Encounter for screening mammogram for malignant neoplasm of breast: Secondary | ICD-10-CM

## 2021-04-09 MED ORDER — ERGOCALCIFEROL 1.25 MG (50000 UT) PO CAPS: 50000.0000 [IU] | ORAL_CAPSULE | ORAL | 0 refills | Status: DC

## 2021-04-09 MED ORDER — VENLAFAXINE HCL ER 37.5 MG PO CP24: ORAL_CAPSULE | ORAL | 11 refills | Status: DC

## 2021-04-09 NOTE — Patient Instructions (Addendum)
[place] at [facility] Discharge Instructions   You were seen and examined today by Dr. Delton Coombes.  He reviewed your lab work with you which is normal/stable.  Stop taking Celexa (citalopram) and start taking Effexor as prescribed.  We sent a prescription for Vitamin D.  You will take 1 pill weekly for 12 weeks, then switch to over the counter Vitamin D 5000 units every day.  Mammogram as scheduled in July.  Return as scheduled for lab work and exam.    Thank you for choosing [place] at [facility] to provide your oncology and hematology care.  To afford each patient quality time with our provider, please arrive at least 15 minutes before your scheduled appointment time.   If you have a lab appointment with the [facility] please come in thru the Main Entrance and check in at the main information desk.  You need to re-schedule your appointment should you arrive 10 or more minutes late.  We strive to give you quality time with our providers, and arriving late affects you and other patients whose appointments are after yours.  Also, if you no show three or more times for appointments you may be dismissed from the clinic at the providers discretion.     Again, thank you for choosing [facility].  Our hope is that these requests will decrease the amount of time that you wait before being seen by our physicians.       _____________________________________________________________  Should you have questions after your visit to [facility], please contact our office at [phone] and follow the prompts.  Our office hours are 8:00 a.m. and 4:30 p.m. Monday - Friday.  Please note that voicemails left after 4:00 p.m. may not be returned until the following business day.  We are closed weekends and major holidays.  You do have access to a nurse 24-7, just call the main number to the clinic [phone] and do not press  any options, hold on the line and a nurse will answer the phone.    For prescription refill requests, have your pharmacy contact our office and allow 72 hours.    Due to Covid, you will need to wear a mask upon entering the hospital. If you do not have a mask, a mask will be given to you at the Main Entrance upon arrival. For doctor visits, patients may have 1 support person age 29 or older with them. For treatment visits, patients can not have anyone with them due to social distancing guidelines and our immunocompromised population.

## 2021-04-09 NOTE — Progress Notes (Signed)
[place] [address],  [address]   Moon Care Team: Lavella Lemons, [place] as PCP - General (Physician Assistant) Danie Binder, MD (Inactive) as Consulting Physician (Gastroenterology)  SUMMARY OF ONCOLOGIC HISTORY: Oncology History  Malignant neoplasm of left female breast ([place])  07/30/2013 Mammogram   Mammogram- 5 cm from nipple, 1.6 x 1.2 x 1.0 cm spiculated, irregular mass in left breast at 11 o'clock position.   08/09/2013 Procedure   US- guided biopsy of left breast mass.   08/09/2013 Pathology Results   Invasive ductal carcinoma   08/20/2013 Procedure   Left lumpectomy and sentinel lymph node biopsy and modified axillary dissection by Dr. Anthony Sar.   08/20/2013 Pathology Results   Invasive tumor is 5 cm in largest dimension, grade 3, negative margins, 1/10 lymph node positive for micrometastasis and 1/10 lymph node positive for macro-metastasis, + LVI.  ER + > 90%, PR + > 90%, Ki-67 23%, HER2+ but ratio was 1.26.   09/27/2013 - 11/08/2013 Chemotherapy   AC x 4 cycles    12/06/2013 - 02/21/2014 Chemotherapy   Weekly Taxol x 12 cycles   04/02/2014 - 05/21/2014 Radiation Therapy   Dr. Pablo Ledger   05/27/2014 - 01/05/2016 Anti-estrogen oral therapy   Tamoxifen 20 mg daily.   01/05/2016 Adverse Reaction   Severe hot flashes.  Laura Moon is 66 years old as of 01/04/2016.  Laura Moon should be post-menopausal.   01/05/2016 -  Anti-estrogen oral therapy   Arimidex   02/02/2016 Imaging   Bone density- BMD as determined from Femur Neck Left is 0.927 g/cm2 with a T-Score of -0.8. This Moon is considered normal according to [facility]) criteria.     CHIEF COMPLIANT: Follow-up for breast cancer   INTERVAL HISTORY: Laura Moon is a 66 y.o. female here today for follow up of her breast cancer. Her last visit was on 09/30/2020.   Today Laura Moon reports doing good. Laura Moon is currently taking anastrozole and tolerating it well. Laura Moon reports hot  flashes throughout Laura day which is worse at night for which Laura Moon is taking Celexa which Laura Moon reports used to help but no longer helps; they occasionally cause nausea.   REVIEW OF SYSTEMS:   Review of Systems  Constitutional:  Negative for appetite change and fatigue.  All other systems reviewed and are negative.  I have reviewed Laura past medical history, past surgical history, social history and family history with Laura Moon and they are unchanged from previous note.   ALLERGIES:   is allergic to effexor [venlafaxine] and hydrocodone.   MEDICATIONS:  Current Outpatient Medications  Medication Sig Dispense Refill   anastrozole (ARIMIDEX) 1 MG tablet Take 1 tablet (1 mg total) by mouth daily. 90 tablet 4   atorvastatin (LIPITOR) 20 MG tablet Take 1 tablet (20 mg total) by mouth daily. 90 tablet 3   calcium-vitamin D (OSCAL WITH D) 500-200 MG-UNIT tablet Take 2 tablets by mouth daily with breakfast. 60 tablet 12   Cholecalciferol (D3 DOTS) 50 MCG (2000 UT) TBDP Take 1 capsule by mouth daily. 30 tablet 12   glimepiride (AMARYL) 1 MG tablet TAKE 1/2 TABLET BY MOUTH ONCE DAILY 45 tablet 3   hydrochlorothiazide (MICROZIDE) 12.5 MG capsule Take 1 capsule (12.5 mg total) daily by mouth. 90 capsule 3   lovastatin (MEVACOR) 40 MG tablet Take 40 mg by mouth daily.     nystatin (MYCOSTATIN/NYSTOP) powder Apply powder under breasts twice daily 30 g 6  OS-CAL CALCIUM + D3 500-200 MG-UNIT TABS Take 2 tablets by mouth every morning.     potassium chloride SA (K-DUR,KLOR-CON) 20 MEQ tablet Take 1 tablet (20 mEq total) by mouth 2 (two) times daily. (Moon taking differently: Take 20 mEq by mouth daily.) 180 tablet 1   venlafaxine XR (EFFEXOR-XR) 37.5 MG 24 hr capsule Take 1 capsule by mouth for 7 days; then increase to 2 pills daily 90 capsule 11   zolpidem (AMBIEN) 10 MG tablet Take 0.5-1 tablets (5-10 mg total) by mouth at bedtime as needed for sleep. (Moon not taking: Reported on 09/03/2019) 30  tablet 1   No current facility-administered medications for this visit.     PHYSICAL EXAMINATION: Performance status (ECOG): 1 - Symptomatic but completely ambulatory  Vitals:   04/09/21 0801  BP: (!) 157/95  Pulse: 86  Resp: 18  Temp: (!) 96.8 F (36 C)  SpO2: 97%   Wt Readings from Last 3 Encounters:  04/09/21 162 lb 7.7 oz (73.7 kg)  01/12/21 154 lb (69.9 kg)  09/30/20 155 lb 4.8 oz (70.4 kg)   Physical Exam Vitals reviewed.  Constitutional:      Appearance: Normal appearance.  Cardiovascular:     Rate and Rhythm: Normal rate and regular rhythm.     Pulses: Normal pulses.     Heart sounds: Normal heart sounds.  Pulmonary:     Effort: Pulmonary effort is normal.     Breath sounds: Normal breath sounds.  Chest:  Breasts:    Right: Normal. No swelling, bleeding, inverted nipple, mass, nipple discharge, skin change or tenderness.     Left: Normal. No swelling, bleeding, inverted nipple, mass, nipple discharge, skin change (UIQ lumpectomy scar WNL) or tenderness.  Abdominal:     Palpations: Abdomen is soft. There is no hepatomegaly, splenomegaly or mass.     Tenderness: There is no abdominal tenderness.  Lymphadenopathy:     Upper Body:     Right upper body: No supraclavicular, axillary or pectoral adenopathy.     Left upper body: No supraclavicular, axillary or pectoral adenopathy.  Neurological:     General: No focal deficit present.     Mental Status: Laura Moon is alert and oriented to person, place, and time.  Psychiatric:        Mood and Affect: Mood normal.        Behavior: Behavior normal.    Breast Exam Chaperone: Thana Ates     LABORATORY DATA:  I have reviewed Laura data as listed CMP Latest Ref Rng & Units 04/02/2021 09/23/2020 02/22/2020  Glucose 70 - 99 mg/dL 207(H) 99 103(H)  BUN 8 - 23 mg/dL $Remove'14 17 14  'JoCnoFG$ Creatinine 0.44 - 1.00 mg/dL 0.82 0.78 1.08(H)  Sodium 135 - 145 mmol/L 138 139 138  Potassium 3.5 - 5.1 mmol/L 3.3(L) 3.6 3.2(L)  Chloride 98 - 111  mmol/L 104 105 104  CO2 22 - 32 mmol/L $RemoveB'26 27 27  'mLTMFTVz$ Calcium 8.9 - 10.3 mg/dL 8.9 9.3 9.1  Total Protein 6.5 - 8.1 g/dL 7.1 7.1 7.2  Total Bilirubin 0.3 - 1.2 mg/dL 0.8 0.5 0.5  Alkaline Phos 38 - 126 U/L 87 83 71  AST 15 - 41 U/L 14(L) 18 17  ALT 0 - 44 U/L $Remo'15 17 14   'AKkxA$ No results found for: [id number] Lab Results  Component Value Date   WBC 6.1 04/02/2021   HGB 13.0 04/02/2021   HCT 39.6 04/02/2021   MCV 84.8 04/02/2021   PLT 196 04/02/2021  NEUTROABS 2.5 04/02/2021    ASSESSMENT:  1.  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  ER positive/PR positive/HER-2 positive. -Treated at [facility] in [place]. -Initially treated with left lumpectomy by Dr. Arlester Marker on 08/20/2013. - Adjuvant chemo with AC x4 cycles followed by Taxol x12 completed on 02/21/2014 -Adjuvant breast radiation therapy completed on 05/21/2014 - Tamoxifen started in March 2016, transition to anastrozole in October 2017. - BCI testing done on 07/19/2017 that showed high likelihood benefit of extending endocrine therapy.  Moon is advised to continue Arimidex for 10 years.    PLAN:  1.  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  Right breast upper inner quadrant scar was for benign lumpectomy.  No palpable adenopathy. - Mammogram on 09/23/2020 reviewed by me was BI-RADS Category 2. - Continue anastrozole daily. - RTC 1 year for follow-up with repeat labs. - We will also schedule mammogram in July this year.   2.  Hot flashes secondary to aromatase inhibitor: - Laura Moon is taking Celexa 20 mg daily which is not helping. - Laura Moon reports hot flashes throughout Laura day and once at nighttime. - I have recommended switching to Effexor 37.5 mg for 1 week, increase Laura dose to 75 mg daily.  Laura Moon was told to call us if Laura Moon  develops any side effects. - Laura Moon will discontinue Celexa Laura day prior to starting Effexor.  3.  Vitamin D deficiency: - Vitamin D level is 19.6. - We will start her on vitamin D 50,000 units weekly for 12 weeks. - Laura Moon was then told to start vitamin D 5000 units daily.  4.  Osteopenia: - DEXA scan on 03/10/2020 with T score -1.5, osteopenia. - Plan to repeat DEXA scan in 2024.     Breast Cancer therapy associated bone loss: I have recommended calcium, Vitamin D and weight bearing exercises.  Orders placed this encounter:  Orders Placed This Encounter  Procedures   MM DIAG BREAST TOMO BILATERAL    Laura Moon has a good understanding of Laura overall plan. Laura Moon agrees with it. Laura Moon will call with any problems that may develop before Laura next visit here.  Derek Jack, MD [place] [phone]   I, Thana Ates, am acting as a scribe for Dr. Derek Jack.  I, Derek Jack MD, have reviewed Laura above documentation for accuracy and completeness, and I agree with Laura above.

## 2021-04-13 ENCOUNTER — Encounter (HOSPITAL_COMMUNITY): Payer: 59

## 2021-05-02 ENCOUNTER — Other Ambulatory Visit (HOSPITAL_COMMUNITY): Payer: Self-pay | Admitting: Hematology

## 2021-05-04 ENCOUNTER — Other Ambulatory Visit (HOSPITAL_COMMUNITY): Payer: Self-pay | Admitting: Hematology

## 2021-05-04 MED ORDER — VENLAFAXINE HCL ER 75 MG PO CP24: ORAL_CAPSULE | ORAL | 3 refills | Status: DC

## 2021-05-05 DIAGNOSIS — K1379 Other lesions of oral mucosa: Secondary | ICD-10-CM | POA: Diagnosis not present

## 2021-05-05 DIAGNOSIS — Z20828 Contact with and (suspected) exposure to other viral communicable diseases: Secondary | ICD-10-CM | POA: Diagnosis not present

## 2021-05-05 DIAGNOSIS — Z6826 Body mass index (BMI) 26.0-26.9, adult: Secondary | ICD-10-CM | POA: Diagnosis not present

## 2021-05-05 DIAGNOSIS — R509 Fever, unspecified: Secondary | ICD-10-CM | POA: Diagnosis not present

## 2021-05-05 DIAGNOSIS — J069 Acute upper respiratory infection, unspecified: Secondary | ICD-10-CM | POA: Diagnosis not present

## 2021-05-05 DIAGNOSIS — J029 Acute pharyngitis, unspecified: Secondary | ICD-10-CM | POA: Diagnosis not present

## 2021-05-17 DIAGNOSIS — R197 Diarrhea, unspecified: Secondary | ICD-10-CM | POA: Diagnosis not present

## 2021-05-17 DIAGNOSIS — R110 Nausea: Secondary | ICD-10-CM | POA: Diagnosis not present

## 2021-05-17 DIAGNOSIS — C50919 Malignant neoplasm of unspecified site of unspecified female breast: Secondary | ICD-10-CM | POA: Diagnosis not present

## 2021-05-17 DIAGNOSIS — E860 Dehydration: Secondary | ICD-10-CM | POA: Diagnosis not present

## 2021-05-17 DIAGNOSIS — Z79899 Other long term (current) drug therapy: Secondary | ICD-10-CM | POA: Diagnosis not present

## 2021-05-17 DIAGNOSIS — I10 Essential (primary) hypertension: Secondary | ICD-10-CM | POA: Diagnosis not present

## 2021-05-17 DIAGNOSIS — E1110 Type 2 diabetes mellitus with ketoacidosis without coma: Secondary | ICD-10-CM | POA: Diagnosis not present

## 2021-05-18 ENCOUNTER — Encounter (HOSPITAL_COMMUNITY): Payer: 59

## 2021-05-18 ENCOUNTER — Ambulatory Visit: Payer: Self-pay | Admitting: Surgery

## 2021-05-19 DIAGNOSIS — E860 Dehydration: Secondary | ICD-10-CM | POA: Diagnosis not present

## 2021-05-19 DIAGNOSIS — C50919 Malignant neoplasm of unspecified site of unspecified female breast: Secondary | ICD-10-CM | POA: Diagnosis not present

## 2021-05-19 DIAGNOSIS — E1110 Type 2 diabetes mellitus with ketoacidosis without coma: Secondary | ICD-10-CM | POA: Diagnosis not present

## 2021-05-19 DIAGNOSIS — I10 Essential (primary) hypertension: Secondary | ICD-10-CM | POA: Diagnosis not present

## 2021-05-19 DIAGNOSIS — Z79899 Other long term (current) drug therapy: Secondary | ICD-10-CM | POA: Diagnosis not present

## 2021-05-22 DIAGNOSIS — Z6827 Body mass index (BMI) 27.0-27.9, adult: Secondary | ICD-10-CM | POA: Diagnosis not present

## 2021-05-22 DIAGNOSIS — I10 Essential (primary) hypertension: Secondary | ICD-10-CM | POA: Diagnosis not present

## 2021-05-22 DIAGNOSIS — E1165 Type 2 diabetes mellitus with hyperglycemia: Secondary | ICD-10-CM | POA: Diagnosis not present

## 2021-05-22 DIAGNOSIS — N179 Acute kidney failure, unspecified: Secondary | ICD-10-CM | POA: Diagnosis not present

## 2021-05-22 DIAGNOSIS — N390 Urinary tract infection, site not specified: Secondary | ICD-10-CM | POA: Diagnosis not present

## 2021-06-01 ENCOUNTER — Encounter (HOSPITAL_COMMUNITY)
Admission: RE | Admit: 2021-06-01 | Discharge: 2021-06-01 | Disposition: A | Discharge status: Home / Self Care | Payer: 59 | Source: Ambulatory Visit | Attending: Ophthalmology | Admitting: Ophthalmology

## 2021-06-15 ENCOUNTER — Encounter (HOSPITAL_COMMUNITY)
Admission: RE | Admit: 2021-06-15 | Discharge: 2021-06-15 | Disposition: A | Discharge status: Home / Self Care | Payer: 59 | Source: Ambulatory Visit | Attending: Ophthalmology | Admitting: Ophthalmology

## 2021-06-17 ENCOUNTER — Encounter (HOSPITAL_COMMUNITY): Payer: Self-pay

## 2021-06-17 NOTE — H&P (Signed)
Surgical History & Physical ? ?Patient Name: Laura Moon DOB: Nov 13, 1955 ? ?Surgery: Cataract extraction with intraocular lens implant phacoemulsification; Left Eye ? ?Surgeon: [name] MD ?Surgery Date:  06-22-21 ?Pre-Op Date:  06-15-21 ? ?HPI: ?A 29 Yr. old female patient 1. The patient is returning for a Cataract check 4 months follow-up of both eyes. Since the last visit, the affected area is tolerating and pt has noticed some gradual blurriness to [place] OD. The patient's vision is stable. The condition's severity is mild. pt denies comfort complaints in either eye and is not currently using any drops. pt has been diagnosed with type 2 diabetes since last visit and states that she was not given her A1C number. HPI was performed by [name] . ? ?Medical History: ?Cataracts ?Macula Degeneration ?Glaucoma ?Cancer ?Diabetes ?LDL ? ?Review of Systems ?Negative Allergic/Immunologic ?Negative Cardiovascular ?Negative Constitutional ?Negative Ear, Nose, Mouth & Throat ?Negative Endocrine ?Negative Eyes ?Negative Gastrointestinal ?Negative Genitourinary ?Negative Hemotologic/Lymphatic ?Negative Integumentary ?Negative Musculoskeletal ?Negative Neurological ?Negative Psychiatry ?Negative Respiratory ? ?Social ?  Never smoked  ? ?Medication ?Citalopram, Lovastatin, Hydrochlorothiazide, Glimeperide, Vitamin D3, Calcium, Anastrozole,  ? ?Sx/Procedures ?Left lumpectomy breast,  ? ?Drug Allergies  ? NKDA ? ?History & Physical: ?Heent: Cataract, Left Eye ?NECK: supple without bruits ?LUNGS: lungs clear to auscultation ?CV: regular rate and rhythm ?Abdomen: soft and non-tender ? ?Impression & Plan: ?Assessment: ?1.  COMBINED FORMS AGE RELATED CATARACT; Both Eyes (H25.813) ?2.  BLEPHARITIS; Right Upper Lid, Right Lower Lid, Left Upper Lid, Left Lower Lid (H01.001, H01.002,H01.004,H01.005) ?3.  Pinguecula; Left Eye (H11.152) ?4.  ASTIGMATISM, REGULAR; Both Eyes (H52.223) ? ?Plan: 1.  Cataract accounts for the patient's  decreased vision. This visual impairment is not correctable with a tolerable change in glasses or contact lenses. Cataract surgery with an implantation of a new lens should significantly improve the visual and functional status of the patient. Discussed all risks, benefits, alternatives, and potential complications. Discussed the procedures and recovery. Patient desires to have surgery. A-scan ordered and performed today for intra-ocular lens calculations. The surgery will be performed in order to improve vision for driving, reading, and for eye examinations. Recommend phacoemulsification with intra-ocular lens. Recommend Dextenza for post-operative pain and inflammation. ?Left Eye worse - first. ?Dilates poorly - shugarcaine by protocol. ?Omidira. ?Toric Lens. - Eyhance Toric or Synergy Toric. ? ?2.  Recommend regular lid cleaning. ? ?3.  Observe; Artificial tears as needed for irritation. ? ?4.  Recommend toric IOL OU as above. ?

## 2021-06-22 ENCOUNTER — Ambulatory Visit (HOSPITAL_COMMUNITY)
Admission: RE | Admit: 2021-06-22 | Discharge: 2021-06-22 | Disposition: A | Discharge status: Home / Self Care | Payer: 59 | Attending: Ophthalmology | Admitting: Ophthalmology

## 2021-06-22 ENCOUNTER — Encounter (HOSPITAL_COMMUNITY)
Admission: RE | Disposition: A | Discharge status: Home / Self Care | Payer: Self-pay | Source: Home / Self Care | Attending: Ophthalmology

## 2021-06-22 ENCOUNTER — Ambulatory Visit (HOSPITAL_COMMUNITY): Payer: 59 | Admitting: Anesthesiology

## 2021-06-22 ENCOUNTER — Ambulatory Visit (HOSPITAL_BASED_OUTPATIENT_CLINIC_OR_DEPARTMENT_OTHER): Payer: 59 | Admitting: Anesthesiology

## 2021-06-22 ENCOUNTER — Encounter (HOSPITAL_COMMUNITY): Payer: Self-pay | Admitting: Ophthalmology

## 2021-06-22 DIAGNOSIS — F32A Depression, unspecified: Secondary | ICD-10-CM | POA: Insufficient documentation

## 2021-06-22 DIAGNOSIS — H25813 Combined forms of age-related cataract, bilateral: Secondary | ICD-10-CM | POA: Diagnosis not present

## 2021-06-22 DIAGNOSIS — Z7984 Long term (current) use of oral hypoglycemic drugs: Secondary | ICD-10-CM | POA: Insufficient documentation

## 2021-06-22 DIAGNOSIS — E119 Type 2 diabetes mellitus without complications: Secondary | ICD-10-CM

## 2021-06-22 DIAGNOSIS — H25812 Combined forms of age-related cataract, left eye: Secondary | ICD-10-CM

## 2021-06-22 DIAGNOSIS — I10 Essential (primary) hypertension: Secondary | ICD-10-CM

## 2021-06-22 DIAGNOSIS — F419 Anxiety disorder, unspecified: Secondary | ICD-10-CM | POA: Diagnosis not present

## 2021-06-22 DIAGNOSIS — Z79899 Other long term (current) drug therapy: Secondary | ICD-10-CM | POA: Insufficient documentation

## 2021-06-22 DIAGNOSIS — H11152 Pinguecula, left eye: Secondary | ICD-10-CM | POA: Insufficient documentation

## 2021-06-22 DIAGNOSIS — E1136 Type 2 diabetes mellitus with diabetic cataract: Secondary | ICD-10-CM | POA: Diagnosis present

## 2021-06-22 DIAGNOSIS — Z794 Long term (current) use of insulin: Secondary | ICD-10-CM

## 2021-06-22 DIAGNOSIS — H0100B Unspecified blepharitis left eye, upper and lower eyelids: Secondary | ICD-10-CM | POA: Insufficient documentation

## 2021-06-22 DIAGNOSIS — H52223 Regular astigmatism, bilateral: Secondary | ICD-10-CM | POA: Insufficient documentation

## 2021-06-22 DIAGNOSIS — H0100A Unspecified blepharitis right eye, upper and lower eyelids: Secondary | ICD-10-CM | POA: Diagnosis not present

## 2021-06-22 HISTORY — PX: CATARACT EXTRACTION W/PHACO: SHX586

## 2021-06-22 LAB — GLUCOSE, CAPILLARY: Glucose-Capillary: 87 mg/dL (ref 70–99)

## 2021-06-22 SURGERY — PHACOEMULSIFICATION, CATARACT, WITH IOL INSERTION
Anesthesia: Monitor Anesthesia Care | Site: Eye | Laterality: Left

## 2021-06-22 MED ORDER — MIDAZOLAM HCL 5 MG/5ML IJ SOLN
INTRAMUSCULAR | Status: DC | PRN
  Administered 2021-06-22: 2 mg via INTRAVENOUS

## 2021-06-22 MED ORDER — STERILE WATER FOR IRRIGATION IR SOLN
Status: DC | PRN
  Administered 2021-06-22: 250 mL

## 2021-06-22 MED ORDER — MIDAZOLAM HCL 2 MG/2ML IJ SOLN
INTRAMUSCULAR | Status: AC
  Filled 2021-06-22: qty 2

## 2021-06-22 MED ORDER — EPINEPHRINE PF 1 MG/ML IJ SOLN
INTRAMUSCULAR | Status: AC
Start: 2021-06-22 — End: ?
  Filled 2021-06-22: qty 1

## 2021-06-22 MED ORDER — SODIUM CHLORIDE 0.9% FLUSH
INTRAVENOUS | Status: DC | PRN
  Administered 2021-06-22: 10 mL via INTRAVENOUS

## 2021-06-22 MED ORDER — DEXTROSE 50 % IV SOLN
INTRAVENOUS | Status: AC
  Administered 2021-06-22: 25 mL via INTRAVENOUS
  Filled 2021-06-22: qty 50

## 2021-06-22 MED ORDER — LIDOCAINE HCL (PF) 1 % IJ SOLN
INTRAMUSCULAR | Status: AC
  Filled 2021-06-22: qty 2

## 2021-06-22 MED ORDER — SODIUM HYALURONATE 23MG/ML IO SOSY
PREFILLED_SYRINGE | INTRAOCULAR | Status: DC | PRN
  Administered 2021-06-22: 0.6 mL via INTRAOCULAR

## 2021-06-22 MED ORDER — PHENYLEPHRINE-KETOROLAC 1-0.3 % IO SOLN
INTRAOCULAR | Status: AC
  Filled 2021-06-22: qty 4

## 2021-06-22 MED ORDER — TROPICAMIDE 1 % OP SOLN
1.0000 [drp] | OPHTHALMIC | Status: AC | PRN
  Administered 2021-06-22 (×3): 1 [drp] via OPHTHALMIC

## 2021-06-22 MED ORDER — DEXTROSE 50 % IV SOLN: 25.0000 mL | Freq: Once | INTRAVENOUS | Status: AC

## 2021-06-22 MED ORDER — SODIUM HYALURONATE 10 MG/ML IO SOLUTION
PREFILLED_SYRINGE | INTRAOCULAR | Status: DC | PRN
  Administered 2021-06-22: 0.85 mL via INTRAOCULAR

## 2021-06-22 MED ORDER — BSS IO SOLN
INTRAOCULAR | Status: DC | PRN
  Administered 2021-06-22: 15 mL via INTRAOCULAR

## 2021-06-22 MED ORDER — PHENYLEPHRINE HCL 2.5 % OP SOLN
1.0000 [drp] | OPHTHALMIC | Status: AC | PRN
  Administered 2021-06-22 (×3): 1 [drp] via OPHTHALMIC

## 2021-06-22 MED ORDER — TETRACAINE HCL 0.5 % OP SOLN
1.0000 [drp] | OPHTHALMIC | Status: AC | PRN
  Administered 2021-06-22 (×3): 1 [drp] via OPHTHALMIC

## 2021-06-22 MED ORDER — PHENYLEPHRINE-KETOROLAC 1-0.3 % IO SOLN
INTRAOCULAR | Status: DC | PRN
  Administered 2021-06-22: 500 mL via OPHTHALMIC

## 2021-06-22 MED ORDER — TRYPAN BLUE 0.06 % IO SOSY
PREFILLED_SYRINGE | INTRAOCULAR | Status: AC
  Filled 2021-06-22: qty 0.5

## 2021-06-22 MED ORDER — LIDOCAINE HCL 3.5 % OP GEL
1.0000 "application " | Freq: Once | OPHTHALMIC | Status: AC
  Administered 2021-06-22: 1 via OPHTHALMIC

## 2021-06-22 MED ORDER — LIDOCAINE HCL (PF) 1 % IJ SOLN
INTRAOCULAR | Status: DC | PRN
  Administered 2021-06-22: 1 mL via OPHTHALMIC

## 2021-06-22 MED ORDER — POVIDONE-IODINE 5 % OP SOLN
OPHTHALMIC | Status: DC | PRN
  Administered 2021-06-22: 1 via OPHTHALMIC

## 2021-06-22 SURGICAL SUPPLY — 16 items
CATARACT SUITE SIGHTPATH (MISCELLANEOUS) ×2 IMPLANT
CLOTH BEACON ORANGE TIMEOUT ST (SAFETY) ×2 IMPLANT
EYE SHIELD UNIVERSAL CLEAR (GAUZE/BANDAGES/DRESSINGS) ×1 IMPLANT
FEE CATARACT SUITE SIGHTPATH (MISCELLANEOUS) ×1 IMPLANT
GLOVE BIOGEL PI IND STRL 7.5 (GLOVE) IMPLANT
GLOVE BIOGEL PI INDICATOR 7.5 (GLOVE) ×1
LENS IOL RAYNER 22.0 (Intraocular Lens) ×2 IMPLANT
LENS IOL RAYONE EMV 22.0 (Intraocular Lens) IMPLANT
NDL HYPO 18GX1.5 BLUNT FILL (NEEDLE) ×1 IMPLANT
NEEDLE HYPO 18GX1.5 BLUNT FILL (NEEDLE) ×2 IMPLANT
PAD ARMBOARD 7.5X6 YLW CONV (MISCELLANEOUS) ×2 IMPLANT
RING MALYGIN 7.0 (MISCELLANEOUS) IMPLANT
SYR TB 1ML LL NO SAFETY (SYRINGE) ×2 IMPLANT
TAPE SURG TRANSPORE 1 IN (GAUZE/BANDAGES/DRESSINGS) IMPLANT
TAPE SURGICAL TRANSPORE 1 IN (GAUZE/BANDAGES/DRESSINGS) ×2
WATER STERILE IRR 250ML POUR (IV SOLUTION) ×2 IMPLANT

## 2021-06-22 NOTE — Interval H&P Note (Signed)
History and Physical Interval Note: ? ?06/22/2021 ?2:11 PM ? ?Laura Moon  has presented today for surgery, with the diagnosis of combined forms age related cataract; left.  The various methods of treatment have been discussed with the patient and family. After consideration of risks, benefits and other options for treatment, the patient has consented to  Procedure(s) with comments: ?CATARACT EXTRACTION PHACO AND INTRAOCULAR LENS PLACEMENT (IOC) (Left) - left as a surgical intervention.  The patient's history has been reviewed, patient examined, no change in status, stable for surgery.  I have reviewed the patient's chart and labs.  Questions were answered to the patient's satisfaction.   ? ? ?[name] ? ? ?

## 2021-06-22 NOTE — Anesthesia Postprocedure Evaluation (Signed)
Anesthesia Post Note ? ?Patient: Laura Moon ? ?Procedure(s) Performed: CATARACT EXTRACTION PHACO AND INTRAOCULAR LENS PLACEMENT (IOC) (Left: Eye) ? ?Patient location during evaluation: PACU ?Anesthesia Type: MAC ?Pain management: pain level controlled ?Vital Signs Assessment: post-procedure vital signs reviewed and stable ?Respiratory status: spontaneous breathing ?Cardiovascular status: blood pressure returned to baseline and stable ?Postop Assessment: no headache ?Anesthetic complications: no ? ? ?No notable events documented. ? ? ?Last Vitals:  ?Vitals:  ? 06/22/21 1314  ?BP: (!) 164/82  ?Pulse: 75  ?Resp: 18  ?SpO2: 100%  ?  ?Last Pain:  ?Vitals:  ? 06/22/21 1314  ?PainSc: 0-No pain  ? ? ?  ?  ?  ?  ?  ?  ? ?Nikko Goldwire ? ? ? ? ?

## 2021-06-22 NOTE — Op Note (Signed)
Date of procedure: 06/22/21 ? ?Pre-operative diagnosis: Visually significant age-related combined cataract, Left Eye (H25.812) ? ?Post-operative diagnosis: Visually significant age-related combined cataract, Left Eye (H25.812) ? ?Procedure: Removal of cataract via phacoemulsification and insertion of intra-ocular lens Rayner RAO200E +22.0D into the capsular bag of the Left Eye ? ?Attending surgeon: Gerda Diss. Marisa Hua, MD, MA ? ?Anesthesia: MAC, Topical Akten ? ?Complications: None ? ?Estimated Blood Loss: <45m (minimal) ? ?Specimens: None ? ?Implants: As above ? ?Indications:  Visually significant age-related cataract, Left Eye ? ?Procedure:  ?The patient was seen and identified in the pre-operative area. The operative eye was identified and dilated.  The operative eye was marked.  Topical anesthesia was administered to the operative eye.    ? ?The patient was then to the operative suite and placed in the supine position.  A timeout was performed confirming the patient, procedure to be performed, and all other relevant information.   The patient's face was prepped and draped in the usual fashion for intra-ocular surgery.  A lid speculum was placed into the operative eye and the surgical microscope moved into place and focused.  An inferotemporal paracentesis was created using a 20 gauge paracentesis blade.  Shugarcaine was injected into the anterior chamber.  Viscoelastic was injected into the anterior chamber.  A temporal clear-corneal main wound incision was created using a 2.465mmicrokeratome.  A continuous curvilinear capsulorrhexis was initiated using an irrigating cystitome and completed using capsulorrhexis forceps.  Hydrodissection and hydrodeliniation were performed.  Viscoelastic was injected into the anterior chamber.  A phacoemulsification handpiece and a chopper as a second instrument were used to remove the nucleus and epinucleus. The irrigation/aspiration handpiece was used to remove any remaining  cortical material.  ? ?The capsular bag was reinflated with viscoelastic, checked, and found to be intact.  The intraocular lens was inserted into the capsular bag.  The irrigation/aspiration handpiece was used to remove any remaining viscoelastic.  The clear corneal wound and paracentesis wounds were then hydrated and checked with Weck-Cels to be watertight.  Maxitrol was instilled in the eye. The lid-speculum was removed.  The drape was removed.  The patient's face was cleaned with a wet and dry 4x4.    A clear shield was taped over the eye. The patient was taken to the post-operative care unit in good condition, having tolerated the procedure well. ? ?Post-Op Instructions: [facility] and will receive all other orders and instructions. ? ?

## 2021-06-22 NOTE — Transfer of Care (Signed)
Immediate Anesthesia Transfer of Care Note ? ?Patient: Laura Moon ? ?Procedure(s) Performed: CATARACT EXTRACTION PHACO AND INTRAOCULAR LENS PLACEMENT (IOC) (Left: Eye) ? ?Patient Location: PACU ? ?Anesthesia Type:MAC ? ?Level of Consciousness: awake ? ?Airway & Oxygen Therapy: Patient Spontanous Breathing ? ?Post-op Assessment: Report given to RN ? ?Post vital signs: Reviewed and stable ? ?Last Vitals:  ?Vitals Value Taken Time  ?BP    ?Temp    ?Pulse    ?Resp    ?SpO2    ? ? ?Last Pain:  ?Vitals:  ? 06/22/21 1314  ?PainSc: 0-No pain  ?   ? ?Patients Stated Pain Goal: 9 (06/22/21 1314) ? ?Complications: No notable events documented. ?

## 2021-06-22 NOTE — Discharge Instructions (Signed)
Please discharge patient when stable, will follow up today with Dr. Riaan Toledo at the [facility] office immediately following discharge.  Leave shield in place until visit.  All paperwork with discharge instructions will be given at the office.  [facility] Address:  [address]  [place], [place] [address]  

## 2021-06-22 NOTE — Anesthesia Preprocedure Evaluation (Signed)
Anesthesia Evaluation  ?Patient identified by MRN, date of birth, ID band ?Patient awake ? ? ? ?Reviewed: ?Allergy & Precautions, NPO status , Patient's Chart, lab work & pertinent test results ? ?Airway ?Mallampati: II ? ?TM Distance: >3 FB ?Neck ROM: Full ? ? ? Dental ? ?(+) Dental Advisory Given, Teeth Intact ?  ?Pulmonary ?neg pulmonary ROS,  ?  ?Pulmonary exam normal ?breath sounds clear to auscultation ? ? ? ? ? ? Cardiovascular ?Exercise Tolerance: Good ?hypertension, Pt. on medications ?Normal cardiovascular exam ?Rhythm:Regular Rate:Normal ? ? ?  ?Neuro/Psych ?PSYCHIATRIC DISORDERS Anxiety Depression negative neurological ROS ?   ? GI/Hepatic ?Neg liver ROS, neg GERD  ,  ?Endo/Other  ?diabetes, Well Controlled, Type 2, Oral Hypoglycemic Agents, Insulin Dependent ? Renal/GU ?negative Renal ROS  ?negative genitourinary ?  ?Musculoskeletal ?negative musculoskeletal ROS ?(+)  ? Abdominal ?  ?Peds ?negative pediatric ROS ?(+)  Hematology ?negative hematology ROS ?(+)   ?Anesthesia Other Findings ? ? Reproductive/Obstetrics ?negative OB ROS ? ?  ? ? ? ? ? ? ? ? ? ? ? ? ? ?  ?  ? ? ? ? ? ? ?Anesthesia Physical ?Anesthesia Plan ? ?ASA: 2 ? ?Anesthesia Plan: MAC  ? ?Post-op Pain Management: Minimal or no pain anticipated  ? ?Induction:  ? ?PONV Risk Score and Plan:  ? ?Airway Management Planned: Nasal Cannula and Natural Airway ? ?Additional Equipment:  ? ?Intra-op Plan:  ? ?Post-operative Plan:  ? ?Informed Consent: I have reviewed the patients History and Physical, chart, labs and discussed the procedure including the risks, benefits and alternatives for the proposed anesthesia with the patient or authorized representative who has indicated his/her understanding and acceptance.  ? ? ? ?Dental advisory given ? ?Plan Discussed with: CRNA and Surgeon ? ?Anesthesia Plan Comments:   ? ? ? ? ? ? ?Anesthesia Quick Evaluation ? ?

## 2021-06-23 MED ORDER — NEOMYCIN-POLYMYXIN-DEXAMETH 3.5-10000-0.1 OP SUSP
OPHTHALMIC | Status: AC | PRN
  Administered 2021-06-22: 1 [drp] via OPHTHALMIC

## 2021-06-24 ENCOUNTER — Encounter (HOSPITAL_COMMUNITY): Payer: Self-pay | Admitting: Ophthalmology

## 2021-06-24 NOTE — Addendum Note (Signed)
Addendum  created 06/24/21 1110 by Orlie Dakin, CRNA  ? Charge Capture section accepted  ?  ?

## 2021-07-09 ENCOUNTER — Encounter (HOSPITAL_COMMUNITY): Payer: Self-pay

## 2021-07-09 ENCOUNTER — Other Ambulatory Visit: Payer: Self-pay

## 2021-07-09 ENCOUNTER — Encounter (HOSPITAL_COMMUNITY)
Admission: RE | Admit: 2021-07-09 | Discharge: 2021-07-09 | Disposition: A | Discharge status: Home / Self Care | Payer: 59 | Source: Ambulatory Visit | Attending: Ophthalmology | Admitting: Ophthalmology

## 2021-07-10 NOTE — H&P (Signed)
Surgical History & Physical ? ?Patient Name: Laura Moon DOB: 03/30/1955 ? ?Surgery: Cataract extraction with intraocular lens implant phacoemulsification; Right Eye ? ?Surgeon: [name] MD ?Surgery Date:  07-17-21 ?Pre-Op Date:  06-29-21 ? ?HPI: ?[name] 1. The patient is returning after cataract post-op. The left eye is affected. Status post cataract post-op, which began 1 weeks ago: Since the last visit, the affected area is doing well. The patient's vision is improved. Patient is following medication instructions, the patient is using the combination post op drop TID OS. The patient experiences no eye pain and no flashes, floater, shadow, curtain or veil. The patient also presents for a pre op OD, and presents with complaints of blurry or hazy vision making it difficult to see things clearly when driving, or when walking. The patient has hard time recognizing people at a distance, and glare from the sun, and from headlights makes it worse. The patient also has difficulty seeing curbs when driving. This is negatively affecting the patient's quality of life and the patient is unable to function adequately in life with the current level of vision. HPI was performed by [name] . ? ?Medical History: ?Cataracts ?Macula Degeneration ?Glaucoma ?Cancer ?Diabetes ?LDL ? ?Review of Systems ?Negative Allergic/Immunologic ?Negative Cardiovascular ?Negative Constitutional ?Negative Ear, Nose, Mouth & Throat ?Negative Endocrine ?Negative Eyes ?Negative Gastrointestinal ?Negative Genitourinary ?Negative Hemotologic/Lymphatic ?Negative Integumentary ?Negative Musculoskeletal ?Negative Neurological ?Negative Psychiatry ?Negative Respiratory ? ?Social ?  Never smoked  ? ?Medication ?Prednisolone-Moxifloxacin-Bromfenac,  ?Citalopram, Lovastatin, Hydrochlorothiazide, Glimeperide, Vitamin D3, Calcium, Anastrozole,  ? ?Sx/Procedures ?Phaco c IOL OS,  ?Left lumpectomy breast,  ? ?Drug Allergies  ?  NKDA ? ?History & Physical: ?Heent: Cataract, Right Eye ?NECK: supple without bruits ?LUNGS: lungs clear to auscultation ?CV: regular rate and rhythm ?Abdomen: soft and non-tender ? ?Impression & Plan: ?Assessment: ?1.  CATARACT EXTRACTION STATUS; Left Eye [phone]) ?2.  COMBINED FORMS AGE RELATED CATARACT; Both Eyes (H25.813) ? ?Plan: 1.  1 week after cataract surgery. Doing well with improved vision and normal eye pressure. Call with any problems or concerns. ?Continue Pred-Moxi-Brom 2x/day for 3 more weeks. ? ?2.  Cataract accounts for the patient's decreased vision. This visual impairment is not correctable with a tolerable change in glasses or contact lenses. Cataract surgery with an implantation of a new lens should significantly improve the visual and functional status of the patient. Discussed all risks, benefits, alternatives, and potential complications. Discussed the procedures and recovery. Patient desires to have surgery. A-scan ordered and performed today for intra-ocular lens calculations. The surgery will be performed in order to improve vision for driving, reading, and for eye examinations. Recommend phacoemulsification with intra-ocular lens. Recommend Dextenza for post-operative pain and inflammation. ?Right Eye. ?Surgery required to correct imbalance of vision. ?Dilates well - shugarcaine by protocol. ?

## 2021-07-17 ENCOUNTER — Ambulatory Visit (HOSPITAL_COMMUNITY)
Admission: RE | Admit: 2021-07-17 | Discharge: 2021-07-17 | Disposition: A | Discharge status: Home / Self Care | Payer: 59 | Attending: Ophthalmology | Admitting: Ophthalmology

## 2021-07-17 ENCOUNTER — Ambulatory Visit (HOSPITAL_COMMUNITY): Payer: 59 | Admitting: Anesthesiology

## 2021-07-17 ENCOUNTER — Encounter (HOSPITAL_COMMUNITY)
Admission: RE | Disposition: A | Discharge status: Home / Self Care | Payer: Self-pay | Source: Home / Self Care | Attending: Ophthalmology

## 2021-07-17 ENCOUNTER — Ambulatory Visit (HOSPITAL_BASED_OUTPATIENT_CLINIC_OR_DEPARTMENT_OTHER): Payer: 59 | Admitting: Anesthesiology

## 2021-07-17 ENCOUNTER — Other Ambulatory Visit (HOSPITAL_COMMUNITY): Payer: Self-pay | Admitting: Physician Assistant

## 2021-07-17 DIAGNOSIS — E119 Type 2 diabetes mellitus without complications: Secondary | ICD-10-CM | POA: Diagnosis not present

## 2021-07-17 DIAGNOSIS — I10 Essential (primary) hypertension: Secondary | ICD-10-CM | POA: Insufficient documentation

## 2021-07-17 DIAGNOSIS — H25811 Combined forms of age-related cataract, right eye: Secondary | ICD-10-CM | POA: Diagnosis not present

## 2021-07-17 DIAGNOSIS — R110 Nausea: Secondary | ICD-10-CM

## 2021-07-17 DIAGNOSIS — Z794 Long term (current) use of insulin: Secondary | ICD-10-CM | POA: Insufficient documentation

## 2021-07-17 DIAGNOSIS — K219 Gastro-esophageal reflux disease without esophagitis: Secondary | ICD-10-CM | POA: Insufficient documentation

## 2021-07-17 DIAGNOSIS — E1136 Type 2 diabetes mellitus with diabetic cataract: Secondary | ICD-10-CM | POA: Insufficient documentation

## 2021-07-17 DIAGNOSIS — Z79899 Other long term (current) drug therapy: Secondary | ICD-10-CM | POA: Diagnosis not present

## 2021-07-17 HISTORY — PX: CATARACT EXTRACTION W/PHACO: SHX586

## 2021-07-17 LAB — GLUCOSE, CAPILLARY
Glucose-Capillary: 88 mg/dL (ref 70–99)
Glucose-Capillary: 132 mg/dL — ABNORMAL HIGH (ref 70–99)

## 2021-07-17 SURGERY — PHACOEMULSIFICATION, CATARACT, WITH IOL INSERTION
Anesthesia: Monitor Anesthesia Care | Site: Eye | Laterality: Right

## 2021-07-17 MED ORDER — SODIUM CHLORIDE 0.9% FLUSH
INTRAVENOUS | Status: DC | PRN
  Administered 2021-07-17: 6 mL via INTRAVENOUS

## 2021-07-17 MED ORDER — TROPICAMIDE 1 % OP SOLN
1.0000 [drp] | OPHTHALMIC | Status: AC | PRN
  Administered 2021-07-17 (×3): 1 [drp] via OPHTHALMIC

## 2021-07-17 MED ORDER — LIDOCAINE HCL (PF) 1 % IJ SOLN
INTRAOCULAR | Status: DC | PRN
  Administered 2021-07-17: 1 mL via OPHTHALMIC

## 2021-07-17 MED ORDER — FENTANYL CITRATE (PF) 100 MCG/2ML IJ SOLN
INTRAMUSCULAR | Status: DC | PRN
  Administered 2021-07-17: 50 ug via INTRAVENOUS

## 2021-07-17 MED ORDER — DEXTROSE 50 % IV SOLN
INTRAVENOUS | Status: AC
  Filled 2021-07-17: qty 50

## 2021-07-17 MED ORDER — EPINEPHRINE PF 1 MG/ML IJ SOLN
INTRAMUSCULAR | Status: AC
  Filled 2021-07-17: qty 2

## 2021-07-17 MED ORDER — LIDOCAINE HCL 3.5 % OP GEL
1.0000 "application " | Freq: Once | OPHTHALMIC | Status: AC
  Administered 2021-07-17: 1 via OPHTHALMIC

## 2021-07-17 MED ORDER — BSS IO SOLN
INTRAOCULAR | Status: DC | PRN
Start: 2021-07-17 — End: 2021-07-17
  Administered 2021-07-17: 15 mL via INTRAOCULAR

## 2021-07-17 MED ORDER — MIDAZOLAM HCL 2 MG/2ML IJ SOLN
INTRAMUSCULAR | Status: AC
  Filled 2021-07-17: qty 2

## 2021-07-17 MED ORDER — SODIUM HYALURONATE 10 MG/ML IO SOLUTION
PREFILLED_SYRINGE | INTRAOCULAR | Status: DC | PRN
  Administered 2021-07-17: 0.85 mL via INTRAOCULAR

## 2021-07-17 MED ORDER — SODIUM HYALURONATE 23MG/ML IO SOSY
PREFILLED_SYRINGE | INTRAOCULAR | Status: DC | PRN
  Administered 2021-07-17: 0.6 mL via INTRAOCULAR

## 2021-07-17 MED ORDER — STERILE WATER FOR IRRIGATION IR SOLN
Status: DC | PRN
  Administered 2021-07-17: 250 mL

## 2021-07-17 MED ORDER — ONDANSETRON HCL 4 MG PO TABS: 4.0000 mg | ORAL_TABLET | Freq: Three times a day (TID) | ORAL | 0 refills | Status: DC | PRN

## 2021-07-17 MED ORDER — EPINEPHRINE PF 1 MG/ML IJ SOLN
INTRAOCULAR | Status: DC | PRN
  Administered 2021-07-17: 500 mL

## 2021-07-17 MED ORDER — MIDAZOLAM HCL 5 MG/5ML IJ SOLN
INTRAMUSCULAR | Status: DC | PRN
  Administered 2021-07-17: 1 mg via INTRAVENOUS

## 2021-07-17 MED ORDER — DEXTROSE 50 % IV SOLN
25.0000 mL | Freq: Once | INTRAVENOUS | Status: AC
Start: 2021-07-17 — End: 2021-07-17
  Administered 2021-07-17: 25 mL via INTRAVENOUS

## 2021-07-17 MED ORDER — PHENYLEPHRINE HCL 2.5 % OP SOLN
1.0000 [drp] | OPHTHALMIC | Status: AC | PRN
  Administered 2021-07-17 (×3): 1 [drp] via OPHTHALMIC

## 2021-07-17 MED ORDER — TETRACAINE HCL 0.5 % OP SOLN
1.0000 [drp] | OPHTHALMIC | Status: AC | PRN
  Administered 2021-07-17 (×3): 1 [drp] via OPHTHALMIC

## 2021-07-17 MED ORDER — FENTANYL CITRATE (PF) 100 MCG/2ML IJ SOLN
INTRAMUSCULAR | Status: AC
  Filled 2021-07-17: qty 2

## 2021-07-17 MED ORDER — POVIDONE-IODINE 5 % OP SOLN
OPHTHALMIC | Status: DC | PRN
Start: 2021-07-17 — End: 2021-07-17
  Administered 2021-07-17: 1 via OPHTHALMIC

## 2021-07-17 MED ORDER — NEOMYCIN-POLYMYXIN-DEXAMETH 3.5-10000-0.1 OP SUSP
OPHTHALMIC | Status: DC | PRN
  Administered 2021-07-17: 1 [drp] via OPHTHALMIC

## 2021-07-17 SURGICAL SUPPLY — 18 items
CATARACT SUITE SIGHTPATH (MISCELLANEOUS) ×2 IMPLANT
CLOTH BEACON ORANGE TIMEOUT ST (SAFETY) ×2 IMPLANT
EYE SHIELD UNIVERSAL CLEAR (GAUZE/BANDAGES/DRESSINGS) ×1 IMPLANT
FEE CATARACT SUITE SIGHTPATH (MISCELLANEOUS) ×1 IMPLANT
GLOVE BIOGEL PI IND STRL 7.0 (GLOVE) ×2 IMPLANT
GLOVE BIOGEL PI IND STRL 8 (GLOVE) IMPLANT
GLOVE BIOGEL PI INDICATOR 7.0 (GLOVE) ×2
GLOVE BIOGEL PI INDICATOR 8 (GLOVE) ×1
GOWN STRL REUS W/TWL LRG LVL3 (GOWN DISPOSABLE) ×1 IMPLANT
LENS IOL RAYNER 22.0 (Intraocular Lens) ×2 IMPLANT
LENS IOL RAYONE EMV 22.0 (Intraocular Lens) IMPLANT
NDL HYPO 18GX1.5 BLUNT FILL (NEEDLE) ×1 IMPLANT
NEEDLE HYPO 18GX1.5 BLUNT FILL (NEEDLE) ×2 IMPLANT
PAD ARMBOARD 7.5X6 YLW CONV (MISCELLANEOUS) ×2 IMPLANT
SYR TB 1ML LL NO SAFETY (SYRINGE) ×2 IMPLANT
TAPE SURG TRANSPORE 1 IN (GAUZE/BANDAGES/DRESSINGS) IMPLANT
TAPE SURGICAL TRANSPORE 1 IN (GAUZE/BANDAGES/DRESSINGS) ×2
WATER STERILE IRR 250ML POUR (IV SOLUTION) ×2 IMPLANT

## 2021-07-17 NOTE — Anesthesia Preprocedure Evaluation (Signed)
Anesthesia Evaluation  ?Patient identified by MRN, date of birth, ID band ?Patient awake ? ? ? ?Reviewed: ?Allergy & Precautions, NPO status , Patient's Chart, lab work & pertinent test results ? ?Airway ?Mallampati: II ? ?TM Distance: >3 FB ?Neck ROM: Full ? ? ? Dental ? ?(+) Dental Advisory Given, Teeth Intact ?  ?Pulmonary ?neg pulmonary ROS,  ?  ?Pulmonary exam normal ?breath sounds clear to auscultation ? ? ? ? ? ? Cardiovascular ?Exercise Tolerance: Good ?hypertension, Pt. on medications ?Normal cardiovascular exam ?Rhythm:Regular Rate:Normal ? ? ?  ?Neuro/Psych ?PSYCHIATRIC DISORDERS Anxiety Depression negative neurological ROS ?   ? GI/Hepatic ?negative GI ROS, Neg liver ROS, neg GERD  ,  ?Endo/Other  ?diabetes, Well Controlled, Type 2, Oral Hypoglycemic Agents, Insulin Dependent ? Renal/GU ?negative Renal ROS  ?negative genitourinary ?  ?Musculoskeletal ?negative musculoskeletal ROS ?(+)  ? Abdominal ?  ?Peds ?negative pediatric ROS ?(+)  Hematology ?negative hematology ROS ?(+)   ?Anesthesia Other Findings ? ? Reproductive/Obstetrics ?negative OB ROS ? ?  ? ? ? ? ? ? ? ? ? ? ? ? ? ?  ?  ? ? ? ? ? ? ? ?Anesthesia Physical ? ?Anesthesia Plan ? ?ASA: 2 ? ?Anesthesia Plan: MAC  ? ?Post-op Pain Management: Minimal or no pain anticipated  ? ?Induction:  ? ?PONV Risk Score and Plan:  ? ?Airway Management Planned: Nasal Cannula and Natural Airway ? ?Additional Equipment:  ? ?Intra-op Plan:  ? ?Post-operative Plan:  ? ?Informed Consent: I have reviewed the patients History and Physical, chart, labs and discussed the procedure including the risks, benefits and alternatives for the proposed anesthesia with the patient or authorized representative who has indicated his/her understanding and acceptance.  ? ? ? ?Dental advisory given ? ?Plan Discussed with: CRNA and Surgeon ? ?Anesthesia Plan Comments:   ? ? ? ? ? ? ?Anesthesia Quick Evaluation ? ?

## 2021-07-17 NOTE — Transfer of Care (Signed)
Immediate Anesthesia Transfer of Care Note ? ?Patient: Laura Moon ? ?Procedure(s) Performed: CATARACT EXTRACTION PHACO AND INTRAOCULAR LENS PLACEMENT (IOC) (Right: Eye) ? ?Patient Location: Short Stay ? ?Anesthesia Type:MAC ? ?Level of Consciousness: awake, alert  and oriented ? ?Airway & Oxygen Therapy: Patient Spontanous Breathing ? ?Post-op Assessment: Report given to RN and Post -op Vital signs reviewed and stable ? ?Post vital signs: Reviewed and stable ? ?Last Vitals:  ?Vitals Value Taken Time  ?BP    ?Temp    ?Pulse    ?Resp    ?SpO2    ? ? ?Last Pain:  ?Vitals:  ? 07/17/21 0800  ?PainSc: 0-No pain  ?   ? ?  ? ?Complications: No notable events documented. ?

## 2021-07-17 NOTE — Anesthesia Postprocedure Evaluation (Signed)
Anesthesia Post Note ? ?Patient: Laura Moon ? ?Procedure(s) Performed: CATARACT EXTRACTION PHACO AND INTRAOCULAR LENS PLACEMENT (IOC) (Right: Eye) ? ?Patient location during evaluation: Phase II ?Anesthesia Type: MAC ?Level of consciousness: awake and alert and oriented ?Pain management: pain level controlled ?Vital Signs Assessment: post-procedure vital signs reviewed and stable ?Respiratory status: spontaneous breathing, nonlabored ventilation and respiratory function stable ?Cardiovascular status: stable and blood pressure returned to baseline ?Postop Assessment: no apparent nausea or vomiting ?Anesthetic complications: no ? ? ?No notable events documented. ? ? ?Last Vitals:  ?Vitals:  ? 07/17/21 0831 07/17/21 0914  ?BP:  (!) 164/83  ?Pulse: 62 64  ?Resp: 16 16  ?Temp:  36.6 ?C  ?SpO2: 100% 100%  ?  ?Last Pain:  ?Vitals:  ? 07/17/21 0914  ?PainSc: 0-No pain  ? ? ?  ?  ?  ?  ?  ?  ? ?Keithon Mccoin C Breslin Burklow ? ? ? ? ?

## 2021-07-17 NOTE — Interval H&P Note (Signed)
History and Physical Interval Note: ? ?07/17/2021 ?8:47 AM ? ?Laura Moon  has presented today for surgery, with the diagnosis of combined forms age related cataract; right.  The various methods of treatment have been discussed with the patient and family. After consideration of risks, benefits and other options for treatment, the patient has consented to  Procedure(s) with comments: ?CATARACT EXTRACTION PHACO AND INTRAOCULAR LENS PLACEMENT (IOC) (Right) - right as a surgical intervention.  The patient's history has been reviewed, patient examined, no change in status, stable for surgery.  I have reviewed the patient's chart and labs.  Questions were answered to the patient's satisfaction.   ? ? ?[name] ? ? ?

## 2021-07-17 NOTE — Op Note (Signed)
Date of procedure: 07/17/21 ? ?Pre-operative diagnosis:  Visually significant combined form age-related cataract, Right Eye (H25.811) ? ?Post-operative diagnosis:  Visually significant combined form age-related cataract, Right Eye (H25.811) ? ?Procedure: Removal of cataract via phacoemulsification and insertion of intra-ocular lens Rayner RAO200E +22.0D into the capsular bag of the Right Eye ? ?Attending surgeon: Gerda Diss. Marisa Hua, MD, MA ? ?Anesthesia: MAC, Topical Akten ? ?Complications: None ? ?Estimated Blood Loss: <21m (minimal) ? ?Specimens: None ? ?Implants: As above ? ?Indications:  Visually significant age-related cataract, Right Eye ? ?Procedure:  ?The patient was seen and identified in the pre-operative area. The operative eye was identified and dilated.  The operative eye was marked.  Topical anesthesia was administered to the operative eye.    ? ?The patient was then to the operative suite and placed in the supine position.  A timeout was performed confirming the patient, procedure to be performed, and all other relevant information.   The patient's face was prepped and draped in the usual fashion for intra-ocular surgery.  A lid speculum was placed into the operative eye and the surgical microscope moved into place and focused.  A superotemporal paracentesis was created using a 20 gauge paracentesis blade.  Shugarcaine was injected into the anterior chamber.  Viscoelastic was injected into the anterior chamber.  A temporal clear-corneal main wound incision was created using a 2.467mmicrokeratome.  A continuous curvilinear capsulorrhexis was initiated using an irrigating cystitome and completed using capsulorrhexis forceps.  Hydrodissection and hydrodeliniation were performed.  Viscoelastic was injected into the anterior chamber.  A phacoemulsification handpiece and a chopper as a second instrument were used to remove the nucleus and epinucleus. The irrigation/aspiration handpiece was used to remove any  remaining cortical material.  ? ?The capsular bag was reinflated with viscoelastic, checked, and found to be intact.  The intraocular lens was inserted into the capsular bag.  The irrigation/aspiration handpiece was used to remove any remaining viscoelastic.  The clear corneal wound and paracentesis wounds were then hydrated and checked with Weck-Cels to be watertight.  The lid-speculum was removed.  The drape was removed.  The patient's face was cleaned with a wet and dry 4x4.   Maxitrol was instilled in the eye. A clear shield was taped over the eye. The patient was taken to the post-operative care unit in good condition, having tolerated the procedure well. ? ?Post-Op Instructions: [facility] and will receive all other orders and instructions. ? ?

## 2021-07-17 NOTE — Progress Notes (Signed)
Called to report nausea with anastrozole.  Instructed to take Zofran as needed. ?

## 2021-07-17 NOTE — Discharge Instructions (Signed)
Please discharge patient when stable, will follow up today with Dr. Marisa Hua at the [facility] at 10:20 AM following discharge.  Leave shield in place until visit.  All paperwork with discharge instructions will be given at the office. ? ?[facility] Address: ? ?[address]  ?[place], [place] [address] ? ?

## 2021-07-20 ENCOUNTER — Encounter (HOSPITAL_COMMUNITY): Payer: Self-pay | Admitting: Ophthalmology

## 2021-08-03 ENCOUNTER — Other Ambulatory Visit (HOSPITAL_COMMUNITY): Payer: Self-pay | Admitting: Physician Assistant

## 2021-08-03 DIAGNOSIS — R110 Nausea: Secondary | ICD-10-CM

## 2021-09-05 ENCOUNTER — Other Ambulatory Visit (HOSPITAL_COMMUNITY): Payer: Self-pay | Admitting: Hematology

## 2021-09-23 ENCOUNTER — Telehealth: Payer: Self-pay | Admitting: *Deleted

## 2021-09-23 NOTE — Chronic Care Management (AMB) (Signed)
  Care Coordination  Note  09/23/2021 Name: Laura Moon MRN: [id number] DOB: 31-Jul-1955  Laura Moon is a 66 y.o. year old female who is a primary care patient of Medicine, [facility]. I reached out to JODA BRAATZ by phone today to offer care coordination services.       Follow up plan: Unsuccessful telephone outreach attempt made. A HIPAA compliant phone message was left for the patient providing contact information and requesting a return call. The care guide will reach out to the patient again over the next 7 days. If patient calls provider office to request assistance with care coordination needs, please contact the care guide at the number below.   [place]  Direct Dial: [phone]

## 2021-09-25 ENCOUNTER — Ambulatory Visit (HOSPITAL_COMMUNITY): Payer: 59

## 2021-10-05 NOTE — Chronic Care Management (AMB) (Signed)
  Care Coordination  Note  10/05/2021 Name: Laura Moon MRN: [id number] DOB: Jan 08, 1956  Laura Moon is a 66 y.o. year old female who is a primary care patient of [facility], [facility]. I reached out to NIHAL DOAN by phone today to offer care coordination services.        Follow up plan: A second unsuccessful telephone outreach attempt made. The care guide will reach out to the patient again over the next 7 days. If patient calls provider office to request assistance with care coordination needs, please contact the care guide at the number below.   [place]  Direct Dial: [phone]

## 2021-10-13 NOTE — Chronic Care Management (AMB) (Signed)
  Care Coordination  Outreach Note  10/13/2021 Name: Laura Moon MRN: [id number] DOB: 07/12/1955   Care Coordination Outreach Attempts  A third unsuccessful outreach was attempted today to offer the patient with information about available care coordination services as a benefit of their health plan.   Follow Up Plan:  No further outreach attempts will be made at this time. We have been unable to contact the patient to offer or enroll patient in care coordination services  Encounter Outcome:  No Answer [place]: [phone]

## 2021-12-03 ENCOUNTER — Ambulatory Visit (HOSPITAL_COMMUNITY): Payer: Self-pay

## 2021-12-17 ENCOUNTER — Ambulatory Visit (HOSPITAL_COMMUNITY): Payer: Self-pay

## 2021-12-18 ENCOUNTER — Ambulatory Visit (HOSPITAL_COMMUNITY)
Admission: RE | Admit: 2021-12-18 | Discharge: 2021-12-18 | Disposition: A | Discharge status: Home / Self Care | Payer: PRIVATE HEALTH INSURANCE | Source: Ambulatory Visit | Attending: Hematology | Admitting: Hematology

## 2021-12-18 DIAGNOSIS — Z1231 Encounter for screening mammogram for malignant neoplasm of breast: Secondary | ICD-10-CM | POA: Insufficient documentation

## 2022-04-06 ENCOUNTER — Inpatient Hospital Stay: Payer: Medicare Other | Attending: Hematology

## 2022-04-06 DIAGNOSIS — E559 Vitamin D deficiency, unspecified: Secondary | ICD-10-CM | POA: Diagnosis not present

## 2022-04-06 DIAGNOSIS — C50312 Malignant neoplasm of lower-inner quadrant of left female breast: Secondary | ICD-10-CM

## 2022-04-06 DIAGNOSIS — Z170 Estrogen receptor positive status [ER+]: Secondary | ICD-10-CM | POA: Diagnosis not present

## 2022-04-06 DIAGNOSIS — Z79811 Long term (current) use of aromatase inhibitors: Secondary | ICD-10-CM | POA: Diagnosis not present

## 2022-04-06 DIAGNOSIS — C50912 Malignant neoplasm of unspecified site of left female breast: Secondary | ICD-10-CM | POA: Insufficient documentation

## 2022-04-06 LAB — CBC WITH DIFFERENTIAL/PLATELET
Abs Immature Granulocytes: 0.01 10*3/uL (ref 0.00–0.07)
Basophils Absolute: 0.1 10*3/uL (ref 0.0–0.1)
Basophils Relative: 1 %
Eosinophils Absolute: 0.2 10*3/uL (ref 0.0–0.5)
Eosinophils Relative: 2 %
HCT: 39.3 % (ref 36.0–46.0)
Hemoglobin: 12.7 g/dL (ref 12.0–15.0)
Immature Granulocytes: 0 %
Lymphocytes Relative: 42 %
Lymphs Abs: 2.6 10*3/uL (ref 0.7–4.0)
MCH: 27.9 pg (ref 26.0–34.0)
MCHC: 32.3 g/dL (ref 30.0–36.0)
MCV: 86.2 fL (ref 80.0–100.0)
Monocytes Absolute: 0.4 10*3/uL (ref 0.1–1.0)
Monocytes Relative: 7 %
Neutro Abs: 3 10*3/uL (ref 1.7–7.7)
Neutrophils Relative %: 48 %
Platelets: 194 10*3/uL (ref 150–400)
RBC: 4.56 MIL/uL (ref 3.87–5.11)
RDW: 13.7 % (ref 11.5–15.5)
WBC: 6.3 10*3/uL (ref 4.0–10.5)
nRBC: 0 % (ref 0.0–0.2)

## 2022-04-06 LAB — COMPREHENSIVE METABOLIC PANEL
ALT: 16 U/L (ref 0–44)
AST: 17 U/L (ref 15–41)
Albumin: 3.8 g/dL (ref 3.5–5.0)
Alkaline Phosphatase: 75 U/L (ref 38–126)
Anion gap: 9 (ref 5–15)
BUN: 20 mg/dL (ref 8–23)
CO2: 24 mmol/L (ref 22–32)
Calcium: 9.3 mg/dL (ref 8.9–10.3)
Chloride: 107 mmol/L (ref 98–111)
Creatinine, Ser: 0.79 mg/dL (ref 0.44–1.00)
GFR, Estimated: >60 mL/min (ref >60)
Glucose, Bld: 85 mg/dL (ref 70–99)
Potassium: 3.7 mmol/L (ref 3.5–5.1)
Sodium: 140 mmol/L (ref 135–145)
Total Bilirubin: 0.5 mg/dL (ref 0.3–1.2)
Total Protein: 7.4 g/dL (ref 6.5–8.1)

## 2022-04-07 LAB — MISC LABCORP TEST (SEND OUT): Labcorp test code: 81950

## 2022-04-13 ENCOUNTER — Inpatient Hospital Stay: Payer: Medicare Other | Admitting: Hematology

## 2022-04-13 VITALS — BP 147/88 | HR 85 | Temp 98.0°F | Resp 18 | Wt 153.7 lb

## 2022-04-13 DIAGNOSIS — Z170 Estrogen receptor positive status [ER+]: Secondary | ICD-10-CM | POA: Diagnosis not present

## 2022-04-13 DIAGNOSIS — C50312 Malignant neoplasm of lower-inner quadrant of left female breast: Secondary | ICD-10-CM

## 2022-04-13 DIAGNOSIS — C50912 Malignant neoplasm of unspecified site of left female breast: Secondary | ICD-10-CM | POA: Diagnosis not present

## 2022-04-13 NOTE — Patient Instructions (Addendum)
[place] at [facility] Discharge Instructions   You were seen and examined today by Dr. Delton Coombes.  He reviewed the results of your lab work which are normal/stable.   He reviewed the results from your mammogram in October which was normal.   We will see you back in 1 year. We will plan to repeat your mammogram this October. We will also repeat lab work prior to your next visit.    Thank you for choosing [place] at [facility] to provide your oncology and hematology care.  To afford each patient quality time with our provider, please arrive at least 15 minutes before your scheduled appointment time.   If you have a lab appointment with the [facility] please come in thru the Main Entrance and check in at the main information desk.  You need to re-schedule your appointment should you arrive 10 or more minutes late.  We strive to give you quality time with our providers, and arriving late affects you and other patients whose appointments are after yours.  Also, if you no show three or more times for appointments you may be dismissed from the clinic at the providers discretion.     Again, thank you for choosing [facility].  Our hope is that these requests will decrease the amount of time that you wait before being seen by our physicians.       _____________________________________________________________  Should you have questions after your visit to [facility], please contact our office at [phone] and follow the prompts.  Our office hours are 8:00 a.m. and 4:30 p.m. Monday - Friday.  Please note that voicemails left after 4:00 p.m. may not be returned until the following business day.  We are closed weekends and major holidays.  You do have access to a nurse 24-7, just call the main number to the clinic [phone] and do not press any options, hold on the line and a nurse will answer the phone.    For  prescription refill requests, have your pharmacy contact our office and allow 72 hours.    Due to Covid, you will need to wear a mask upon entering the hospital. If you do not have a mask, a mask will be given to you at the Main Entrance upon arrival. For doctor visits, patients may have 1 support person age 66 or older with them. For treatment visits, patients can not have anyone with them due to social distancing guidelines and our immunocompromised population.

## 2022-04-13 NOTE — Progress Notes (Signed)
[name] [address] [place] [address]   Patient Care Team: Medicine, [facility] Internal as PCP - General (Internal Medicine) Danie Binder, MD (Inactive) as Consulting Physician (Gastroenterology)  SUMMARY OF ONCOLOGIC HISTORY: Oncology History  Malignant neoplasm of left female breast ([place])  07/30/2013 Mammogram   Mammogram- 5 cm from nipple, 1.6 x 1.2 x 1.0 cm spiculated, irregular mass in left breast at 11 o'clock position.   08/09/2013 Procedure   US- guided biopsy of left breast mass.   08/09/2013 Pathology Results   Invasive ductal carcinoma   08/20/2013 Procedure   Left lumpectomy and sentinel lymph node biopsy and modified axillary dissection by Dr. Anthony Sar.   08/20/2013 Pathology Results   Invasive tumor is 5 cm in largest dimension, grade 3, negative margins, 1/10 lymph node positive for micrometastasis and 1/10 lymph node positive for macro-metastasis, + LVI.  ER + > 90%, PR + > 90%, Ki-67 23%, HER2+ but ratio was 1.26.   09/27/2013 - 11/08/2013 Chemotherapy   AC x 4 cycles    12/06/2013 - 02/21/2014 Chemotherapy   Weekly Taxol x 12 cycles   04/02/2014 - 05/21/2014 Radiation Therapy   Dr. Pablo Ledger   05/27/2014 - 01/05/2016 Anti-estrogen oral therapy   Tamoxifen 20 mg daily.   01/05/2016 Adverse Reaction   Severe hot flashes.  She is 67 years old as of 01/04/2016.  She should be post-menopausal.   01/05/2016 -  Anti-estrogen oral therapy   Arimidex   02/02/2016 Imaging   Bone density- BMD as determined from Fem[facility] Neck Left is 0.927 g/cm2 with a T-Score of -0.8. This patient is considered normal according to [facility]) criteria.     CHIEF COMPLIANT: Follow-up for left breast cancer   INTERVAL HISTORY: [name] is a 67 y.o. female here today for follow up of her breast cancer. Her last visit was on 04/09/21.   Today, she reports that she has been doing well overall. She reports that her appetite is at 85% and  her energy levels are at 90%. She is currently taking Anastrozole and tolerating it well. She reports that she continues to have hot flashes throughout the day which are worse at night. She has hot flashes all throughout the night, often keeping her awake. She has been taking Effexor '75mg'$  and feels that this has helped with her hot flashes more than the Celexa.   She reports some intermittent diarrhea which she attributes to recently starting Metformin. She denies any pain.  She is compliant with Vitamin D 50,000 units daily.   REVIEW OF SYSTEMS:   Review of Systems  Constitutional:  Negative for appetite change, chills, fatigue and fever.  HENT:   Negative for lump/mass, mouth sores, nosebleeds, sore throat and trouble swallowing.   Eyes:  Negative for eye problems.  Respiratory:  Negative for cough.   Cardiovascular:  Negative for chest pain, leg swelling and palpitations.  Gastrointestinal:  Positive for diarrhea. Negative for abdominal pain, constipation, nausea and vomiting.  Endocrine: Positive for hot flashes.  Genitourinary:  Negative for bladder incontinence, difficulty urinating, dysuria, frequency, hematuria and nocturia.   Musculoskeletal:  Negative for arthralgias, back pain, flank pain, myalgias, neck pain and neck stiffness.  Skin:  Negative for itching and rash.  Neurological:  Negative for dizziness, headaches and numbness.  Hematological:  Does not bruise/bleed easily.  Psychiatric/Behavioral:  Negative for depression, sleep disturbance and suicidal ideas. The patient is not nervous/anxious.   All other  systems reviewed and are negative.   I have reviewed the past medical history, past surgical history, social history and family history with the patient and they are unchanged from previous note.   ALLERGIES:   is allergic to effexor [venlafaxine] and hydrocodone.   MEDICATIONS:  Current Outpatient Medications  Medication Sig Dispense Refill   anastrozole  (ARIMIDEX) 1 MG tablet Take 1 tablet (1 mg total) by mouth daily. 90 tablet 4   atorvastatin (LIPITOR) 20 MG tablet Take 1 tablet (20 mg total) by mouth daily. 90 tablet 3   calcium-vitamin D (OSCAL WITH D) 500-200 MG-UNIT tablet Take 2 tablets by mouth daily with breakfast. 60 tablet 12   Cholecalciferol (D3 DOTS) 50 MCG (2000 UT) TBDP Take 1 capsule by mouth daily. 30 tablet 12   ergocalciferol (VITAMIN D2) 1.25 MG (50000 UT) capsule Take 1 capsule (50,000 Units total) by mouth once a week. 12 capsule 0   glimepiride (AMARYL) 1 MG tablet TAKE 1/2 TABLET BY MOUTH ONCE DAILY 45 tablet 3   hydrochlorothiazide (MICROZIDE) 12.5 MG capsule Take 1 capsule (12.5 mg total) daily by mouth. 90 capsule 3   lovastatin (MEVACOR) 40 MG tablet Take 40 mg by mouth daily.     nystatin (MYCOSTATIN/NYSTOP) powder Apply powder under breasts twice daily 30 g 6   ondansetron (ZOFRAN) 4 MG tablet TAKE 1 TABLET BY MOUTH EVERY 8 HOURS AS NEEDED FOR NAUSEA AND VOMITING 18 tablet 1   OS-CAL CALCIUM + D3 500-200 MG-UNIT TABS Take 2 tablets by mouth every morning.     potassium chloride SA (K-DUR,KLOR-CON) 20 MEQ tablet Take 1 tablet (20 mEq total) by mouth 2 (two) times daily. (Patient taking differently: Take 20 mEq by mouth daily.) 180 tablet 1   venlafaxine XR (EFFEXOR-XR) 75 MG 24 hr capsule TAKE 1 CAPSULE BY MOUTH EVERY DAY 90 capsule 4   zolpidem (AMBIEN) 10 MG tablet Take 0.5-1 tablets (5-10 mg total) by mouth at bedtime as needed for sleep. (Patient not taking: Reported on 09/03/2019) 30 tablet 1   No current facility-administered medications for this visit.   Facility-Administered Medications Ordered in Other Visits  Medication Dose Route Frequency Provider Last Rate Last Admin   neomycin-polymyxin b-dexamethasone (MAXITROL) ophthalmic suspension    PRN [name], MD   1 drop at 06/22/21 1435     PHYSICAL EXAMINATION: Performance status (ECOG): 1 - Symptomatic but completely ambulatory  Vitals:    04/13/22 0806  BP: (!) 147/88  Pulse: 85  Resp: 18  Temp: 98 F (36.7 C)  SpO2: 100%   Wt Readings from Last 3 Encounters:  04/13/22 69.7 kg (153 lb 11.2 oz)  07/09/21 63 kg (138 lb 14.2 oz)  06/17/21 63 kg (139 lb)   Physical Exam Vitals reviewed. Exam conducted with a chaperone present.  Constitutional:      Appearance: Normal appearance.  Cardiovascular:     Rate and Rhythm: Normal rate and regular rhythm.     Pulses: Normal pulses.     Heart sounds: Normal heart sounds.  Pulmonary:     Effort: Pulmonary effort is normal.     Breath sounds: Normal breath sounds.  Chest:  Breasts:    Right: Normal. No swelling, bleeding, inverted nipple, mass, nipple discharge, skin change or tenderness.     Left: Normal. No swelling, bleeding, inverted nipple, mass, nipple discharge, skin change or tenderness.     Comments: Left breast UIQ lumpectomy scar stable with no masses No axillary lymph nodes palpable Abdominal:  Palpations: Abdomen is soft. There is no hepatomegaly, splenomegaly or mass.     Tenderness: There is no abdominal tenderness.  Lymphadenopathy:     Upper Body:     Right upper body: No supraclavicular, axillary or pectoral adenopathy.     Left upper body: No supraclavicular, axillary or pectoral adenopathy.  Neurological:     General: No focal deficit present.     Mental Status: She is alert and oriented to person, place, and time.  Psychiatric:        Mood and Affect: Mood normal.        Behavior: Behavior normal.     Breast Exam Chaperone: Thana Ates     LABORATORY DATA:  I have reviewed the data as listed    Latest Ref Rng & Units 04/06/2022    9:25 AM 04/02/2021    9:08 AM 09/23/2020    2:00 PM  CMP  Glucose 70 - 99 mg/dL 85  207  99   BUN 8 - 23 mg/dL '20  14  17   '$ Creatinine 0.44 - 1.00 mg/dL 0.79  0.82  0.78   Sodium 135 - 145 mmol/L 140  138  139   Potassium 3.5 - 5.1 mmol/L 3.7  3.3  3.6   Chloride 98 - 111 mmol/L 107  104  105   CO2 22 -  32 mmol/L '24  26  27   '$ Calcium 8.9 - 10.3 mg/dL 9.3  8.9  9.3   Total Protein 6.5 - 8.1 g/dL 7.4  7.1  7.1   Total Bilirubin 0.3 - 1.2 mg/dL 0.5  0.8  0.5   Alkaline Phos 38 - 126 U/L 75  87  83   AST 15 - 41 U/L '17  14  18   '$ ALT 0 - 44 U/L '16  15  17    '$ No results found for: "CAN153" Lab Results  Component Value Date   WBC 6.3 04/06/2022   HGB 12.7 04/06/2022   HCT 39.3 04/06/2022   MCV 86.2 04/06/2022   PLT 194 04/06/2022   NEUTROABS 3.0 04/06/2022    ASSESSMENT:  1.  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  ER positive/PR positive/HER-2 positive. -Treated at [facility] in [place]. -Initially treated with left lumpectomy by Dr. Arlester Marker on 08/20/2013. - Adjuvant chemo with AC x4 cycles followed by Taxol x12 completed on 02/21/2014 -Adjuvant breast radiation therapy completed on 05/21/2014 - Tamoxifen started in March 2016, transition to anastrozole in October 2017. - BCI testing done on 07/19/2017 that showed high likelihood benefit of extending endocrine therapy.  Patient is advised to continue Arimidex for 10 years.    PLAN:  1.  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 - Labs show normal LFTs and CBC. - Mammogram on 12/18/2021 reviewed by me was BI-RADS Category 2. - Continue anastrozole.  RTC 1 year for follow-up.   2.  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 - She still has hot flashes but much better controlled than while she was on Celexa. - We talked about new medication Ve[place].  She would like to hold off on it at this time.  3.  Vitamin D deficiency: - Continue vitamin D 5000 units daily.  Vitamin D level is 67.  4.  Osteopenia: - Last DEXA scan in 2021 with T-score -1.5. - I will plan on repeating DEXA scan prior to  next visit.   Breast Cancer therapy associated bone loss: I have recommended calcium, Vitamin D and weight bearing exercises.   Orders placed this encounter:  Orders Placed This Encounter  Procedures   MM 3D SCREEN BREAST BILATERAL   DG Bone Density   CBC with Differential   VITAMIN D 25 Hydroxy (Vit-D Deficiency, Fractures)   Comprehensive metabolic panel    The patient has a good understanding of the overall plan. She agrees with it. She will call with any problems that may develop before the next visit here.    I,Alexis Herring,acting as a Education administrator for [employer], MD.,have documented all relevant documentation on the behalf of Derek Jack, MD,as directed by  Derek Jack, MD while in the presence of Derek Jack, MD.  I, Derek Jack MD, have reviewed the above documentation for accuracy and completeness, and I agree with the above.   I, Derek Jack MD, have reviewed the above documentation for accuracy and completeness, and I agree with the above.    Derek Jack, MD [place] [phone]

## 2022-04-29 ENCOUNTER — Other Ambulatory Visit: Payer: Self-pay | Admitting: Hematology

## 2022-04-29 DIAGNOSIS — F411 Generalized anxiety disorder: Secondary | ICD-10-CM

## 2022-04-29 DIAGNOSIS — F419 Anxiety disorder, unspecified: Secondary | ICD-10-CM

## 2022-07-22 DIAGNOSIS — I10 Essential (primary) hypertension: Secondary | ICD-10-CM | POA: Diagnosis not present

## 2022-07-22 DIAGNOSIS — Z7189 Other specified counseling: Secondary | ICD-10-CM | POA: Diagnosis not present

## 2022-07-22 DIAGNOSIS — E7800 Pure hypercholesterolemia, unspecified: Secondary | ICD-10-CM | POA: Diagnosis not present

## 2022-07-22 DIAGNOSIS — Z0000 Encounter for general adult medical examination without abnormal findings: Secondary | ICD-10-CM | POA: Diagnosis not present

## 2022-07-22 DIAGNOSIS — Z299 Encounter for prophylactic measures, unspecified: Secondary | ICD-10-CM | POA: Diagnosis not present

## 2022-07-22 DIAGNOSIS — Z79899 Other long term (current) drug therapy: Secondary | ICD-10-CM | POA: Diagnosis not present

## 2022-07-23 DIAGNOSIS — Z79899 Other long term (current) drug therapy: Secondary | ICD-10-CM | POA: Diagnosis not present

## 2022-07-23 DIAGNOSIS — E7800 Pure hypercholesterolemia, unspecified: Secondary | ICD-10-CM | POA: Diagnosis not present

## 2022-07-26 ENCOUNTER — Encounter (INDEPENDENT_AMBULATORY_CARE_PROVIDER_SITE_OTHER): Payer: Self-pay | Admitting: *Deleted

## 2022-09-06 DIAGNOSIS — E119 Type 2 diabetes mellitus without complications: Secondary | ICD-10-CM | POA: Diagnosis not present

## 2022-09-06 DIAGNOSIS — I10 Essential (primary) hypertension: Secondary | ICD-10-CM | POA: Diagnosis not present

## 2022-09-06 DIAGNOSIS — Z299 Encounter for prophylactic measures, unspecified: Secondary | ICD-10-CM | POA: Diagnosis not present

## 2022-10-03 ENCOUNTER — Other Ambulatory Visit: Payer: Self-pay | Admitting: Hematology

## 2022-12-16 DIAGNOSIS — I10 Essential (primary) hypertension: Secondary | ICD-10-CM | POA: Diagnosis not present

## 2022-12-16 DIAGNOSIS — Z299 Encounter for prophylactic measures, unspecified: Secondary | ICD-10-CM | POA: Diagnosis not present

## 2022-12-16 DIAGNOSIS — E1165 Type 2 diabetes mellitus with hyperglycemia: Secondary | ICD-10-CM | POA: Diagnosis not present

## 2022-12-16 DIAGNOSIS — G4700 Insomnia, unspecified: Secondary | ICD-10-CM | POA: Diagnosis not present

## 2022-12-16 DIAGNOSIS — B372 Candidiasis of skin and nail: Secondary | ICD-10-CM | POA: Diagnosis not present

## 2022-12-16 DIAGNOSIS — Z23 Encounter for immunization: Secondary | ICD-10-CM | POA: Diagnosis not present

## 2022-12-22 ENCOUNTER — Encounter (HOSPITAL_COMMUNITY): Payer: Self-pay

## 2022-12-22 ENCOUNTER — Ambulatory Visit (HOSPITAL_COMMUNITY)
Admission: RE | Admit: 2022-12-22 | Discharge: 2022-12-22 | Disposition: A | Discharge status: Home / Self Care | Payer: Medicare Other | Source: Ambulatory Visit | Attending: Hematology | Admitting: Hematology

## 2022-12-22 DIAGNOSIS — Z1231 Encounter for screening mammogram for malignant neoplasm of breast: Secondary | ICD-10-CM | POA: Insufficient documentation

## 2022-12-22 DIAGNOSIS — Z853 Personal history of malignant neoplasm of breast: Secondary | ICD-10-CM | POA: Insufficient documentation

## 2022-12-22 DIAGNOSIS — Z9012 Acquired absence of left breast and nipple: Secondary | ICD-10-CM | POA: Diagnosis not present

## 2022-12-22 DIAGNOSIS — C50312 Malignant neoplasm of lower-inner quadrant of left female breast: Secondary | ICD-10-CM

## 2023-01-13 ENCOUNTER — Encounter (INDEPENDENT_AMBULATORY_CARE_PROVIDER_SITE_OTHER): Payer: Self-pay | Admitting: *Deleted

## 2023-03-16 HISTORY — PX: WRIST SURGERY: SHX841

## 2023-03-22 ENCOUNTER — Other Ambulatory Visit (HOSPITAL_COMMUNITY): Payer: Medicare Other

## 2023-03-28 ENCOUNTER — Other Ambulatory Visit (HOSPITAL_COMMUNITY): Payer: Medicare Other

## 2023-03-31 ENCOUNTER — Ambulatory Visit (HOSPITAL_COMMUNITY)
Admission: RE | Admit: 2023-03-31 | Discharge: 2023-03-31 | Disposition: A | Discharge status: Home / Self Care | Payer: Medicare Other | Source: Ambulatory Visit | Attending: Hematology | Admitting: Hematology

## 2023-03-31 DIAGNOSIS — M85851 Other specified disorders of bone density and structure, right thigh: Secondary | ICD-10-CM | POA: Insufficient documentation

## 2023-03-31 DIAGNOSIS — Z13820 Encounter for screening for osteoporosis: Secondary | ICD-10-CM | POA: Diagnosis not present

## 2023-03-31 DIAGNOSIS — Z780 Asymptomatic menopausal state: Secondary | ICD-10-CM | POA: Diagnosis not present

## 2023-03-31 DIAGNOSIS — C50312 Malignant neoplasm of lower-inner quadrant of left female breast: Secondary | ICD-10-CM

## 2023-03-31 DIAGNOSIS — Z170 Estrogen receptor positive status [ER+]: Secondary | ICD-10-CM | POA: Insufficient documentation

## 2023-04-06 DIAGNOSIS — G4700 Insomnia, unspecified: Secondary | ICD-10-CM | POA: Diagnosis not present

## 2023-04-06 DIAGNOSIS — E119 Type 2 diabetes mellitus without complications: Secondary | ICD-10-CM | POA: Diagnosis not present

## 2023-04-06 DIAGNOSIS — C50912 Malignant neoplasm of unspecified site of left female breast: Secondary | ICD-10-CM | POA: Diagnosis not present

## 2023-04-06 DIAGNOSIS — D849 Immunodeficiency, unspecified: Secondary | ICD-10-CM | POA: Diagnosis not present

## 2023-04-06 DIAGNOSIS — I10 Essential (primary) hypertension: Secondary | ICD-10-CM | POA: Diagnosis not present

## 2023-04-06 DIAGNOSIS — E1165 Type 2 diabetes mellitus with hyperglycemia: Secondary | ICD-10-CM | POA: Diagnosis not present

## 2023-04-06 DIAGNOSIS — Z299 Encounter for prophylactic measures, unspecified: Secondary | ICD-10-CM | POA: Diagnosis not present

## 2023-04-07 ENCOUNTER — Inpatient Hospital Stay: Payer: Medicare Other | Attending: Hematology

## 2023-04-07 DIAGNOSIS — E559 Vitamin D deficiency, unspecified: Secondary | ICD-10-CM | POA: Insufficient documentation

## 2023-04-07 DIAGNOSIS — C50312 Malignant neoplasm of lower-inner quadrant of left female breast: Secondary | ICD-10-CM | POA: Insufficient documentation

## 2023-04-07 DIAGNOSIS — Z79811 Long term (current) use of aromatase inhibitors: Secondary | ICD-10-CM | POA: Diagnosis not present

## 2023-04-07 DIAGNOSIS — M8580 Other specified disorders of bone density and structure, unspecified site: Secondary | ICD-10-CM | POA: Diagnosis not present

## 2023-04-07 DIAGNOSIS — Z170 Estrogen receptor positive status [ER+]: Secondary | ICD-10-CM | POA: Insufficient documentation

## 2023-04-07 LAB — COMPREHENSIVE METABOLIC PANEL
ALT: 16 U/L (ref 0–44)
AST: 18 U/L (ref 15–41)
Albumin: 3.9 g/dL (ref 3.5–5.0)
Alkaline Phosphatase: 64 U/L (ref 38–126)
Anion gap: 8 (ref 5–15)
BUN: 17 mg/dL (ref 8–23)
CO2: 29 mmol/L (ref 22–32)
Calcium: 9.6 mg/dL (ref 8.9–10.3)
Chloride: 103 mmol/L (ref 98–111)
Creatinine, Ser: 0.8 mg/dL (ref 0.44–1.00)
GFR, Estimated: >60 mL/min (ref >60)
Glucose, Bld: 63 mg/dL — ABNORMAL LOW (ref 70–99)
Potassium: 3.1 mmol/L — ABNORMAL LOW (ref 3.5–5.1)
Sodium: 140 mmol/L (ref 135–145)
Total Bilirubin: 0.7 mg/dL (ref 0.0–1.2)
Total Protein: 7.2 g/dL (ref 6.5–8.1)

## 2023-04-07 LAB — CBC WITH DIFFERENTIAL/PLATELET
Abs Immature Granulocytes: 0.02 10*3/uL (ref 0.00–0.07)
Basophils Absolute: 0.1 10*3/uL (ref 0.0–0.1)
Basophils Relative: 1 %
Eosinophils Absolute: 0.2 10*3/uL (ref 0.0–0.5)
Eosinophils Relative: 3 %
HCT: 39.6 % (ref 36.0–46.0)
Hemoglobin: 12.8 g/dL (ref 12.0–15.0)
Immature Granulocytes: 0 %
Lymphocytes Relative: 39 %
Lymphs Abs: 2.6 10*3/uL (ref 0.7–4.0)
MCH: 27.6 pg (ref 26.0–34.0)
MCHC: 32.3 g/dL (ref 30.0–36.0)
MCV: 85.3 fL (ref 80.0–100.0)
Monocytes Absolute: 0.6 10*3/uL (ref 0.1–1.0)
Monocytes Relative: 9 %
Neutro Abs: 3.1 10*3/uL (ref 1.7–7.7)
Neutrophils Relative %: 48 %
Platelets: 205 10*3/uL (ref 150–400)
RBC: 4.64 MIL/uL (ref 3.87–5.11)
RDW: 14.1 % (ref 11.5–15.5)
WBC: 6.7 10*3/uL (ref 4.0–10.5)
nRBC: 0 % (ref 0.0–0.2)

## 2023-04-07 LAB — VITAMIN D 25 HYDROXY (VIT D DEFICIENCY, FRACTURES): Vit D, 25-Hydroxy: 117.34 ng/mL — ABNORMAL HIGH (ref 30–100)

## 2023-04-12 NOTE — Progress Notes (Signed)
[facility] [address], [place] [address]    Clinic Day:  04/14/2023  Referring physician: Medicine, Jonita Albee Internal  Patient Care Team: Ignatius Specking, MD as PCP - General (Internal Medicine) [place] Bali, MD (Inactive) as Consulting Physician (Gastroenterology)   ASSESSMENT & PLAN:   Assessment: 1.  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  ER positive/PR positive/HER-2 positive. -Treated at [facility] in [place]. -Initially treated with left lumpectomy by Dr. Catalina Gravel on 08/20/2013. - Adjuvant chemo with AC x4 cycles followed by Taxol x12 completed on 02/21/2014 -Adjuvant breast radiation therapy completed on 05/21/2014 - Tamoxifen started in March 2016, transition to anastrozole in October 2017. - BCI testing done on 07/19/2017 that showed high likelihood benefit of extending endocrine therapy.  Patient is advised to continue Arimidex for 10 years.  Plan: 1.  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 - Physical exam: Lumpectomy scar in the left breast upper inner quadrant is stable.  No palpable masses.  No palpable adenopathy. - Reviewed labs from 04/07/2023: Normal LFTs and CBC. - Bilateral mammograms on 12/22/2022: BI-RADS Category 2. - Continue anastrozole until March 2026.  RTC 1 year for follow-up.  Will arrange for mammogram in October.   2.  Hot flashes secondary to aromatase inhibitor: - Continue Effexor 75 mg daily.  She still has some hot flashes.  3.  Vitamin D deficiency: - She is taking vitamin D 50,000 units weekly.  Vitamin D level is 117.  Recommend stopping weekly vitamin D and start taking 5000 units daily.  4.  Osteopenia: - DEXA scan in 2021 with T-score -1.5. - We reviewed DEXA scan from 03/31/2023: T-score -1.6, similar. - Continue calcium and vitamin D supplements.    Breast Cancer therapy associated bone  loss: I have recommended calcium, Vitamin D and weight bearing exercises.  Orders Placed This Encounter  Procedures   MM 3D SCREENING MAMMOGRAM BILATERAL BREAST    NAS No implants NMD-per office-[place]    Standing Status:   Future    Expected Date:   01/03/2024    Expiration Date:   04/13/2024    Reason for Exam (SYMPTOM  OR DIAGNOSIS REQUIRED):   breast cancer screening    Preferred imaging location?:   [facility]      I,Helena R Teague,acting as a scribe for Doreatha Massed, MD.,have documented all relevant documentation on the behalf of Doreatha Massed, MD,as directed by  Doreatha Massed, MD while in the presence of Doreatha Massed, MD.   I, Doreatha Massed MD, have reviewed the above documentation for accuracy and completeness, and I agree with the above.   Doreatha Massed, MD   1/30/20259:32 AM  CHIEF COMPLAINT:   Diagnosis: Malignant neoplasm of lower-inner quadrant of left breast    Cancer Staging  Malignant neoplasm of left female breast [facility]) Staging form: Breast, AJCC 7th Edition - Clinical stage from 07/30/2013: Stage IIB (T2, N1, M0) - Signed by Ellouise Newer, PA-C on 01/05/2016   HISTORY OF PRESENT ILLNESS:   Oncology History  Malignant neoplasm of left female breast (HCC)  07/30/2013 Mammogram   Mammogram- 5 cm from nipple, 1.6 x 1.2 x 1.0 cm spiculated, irregular mass in left breast at 11 o'clock position.   08/09/2013 Procedure   US- guided biopsy of left breast mass.   08/09/2013 Pathology Results   Invasive ductal carcinoma  08/20/2013 Procedure   Left lumpectomy and sentinel lymph node biopsy and modified axillary dissection by Dr. Gabriel Cirri.   08/20/2013 Pathology Results   Invasive tumor is 5 cm in largest dimension, grade 3, negative margins, 1/10 lymph node positive for micrometastasis and 1/10 lymph node positive for macro-metastasis, + LVI.  ER + > 90%, PR + > 90%, Ki-67 23%, HER2+ but ratio was 1.26.    09/27/2013 - 11/08/2013 Chemotherapy   AC x 4 cycles    12/06/2013 - 02/21/2014 Chemotherapy   Weekly Taxol x 12 cycles   04/02/2014 - 05/21/2014 Radiation Therapy   Dr. Michell Heinrich   05/27/2014 - 01/05/2016 Anti-estrogen oral therapy   Tamoxifen 20 mg daily.   01/05/2016 Adverse Reaction   Severe hot flashes.  She is 68 years old as of 01/04/2016.  She should be post-menopausal.   01/05/2016 -  Anti-estrogen oral therapy   Arimidex   02/02/2016 Imaging   Bone density- BMD as determined from Femur Neck Left is 0.927 g/cm2 with a T-Score of -0.8. This patient is considered normal according to World Health Organization [facility]) criteria.      INTERVAL HISTORY:   Laura Moon is a 68 y.o. female presenting to clinic today for follow up of Malignant neoplasm of lower-inner quadrant of left breast. She was last seen by me on 04/13/22.  Since her last visit, she underwent bilateral MM in 12/22/22 that found: no mammographic evidence of malignancy. DEXA scan was done on 03/31/23 with a T-score of -1.6, which is considered osteopenic.  Today, she states that she is doing well overall. Her appetite level is at 100%. Her energy level is at 100%. She is accompanied by a family member. She reports intermittent numbness in fingers is stable. She is taking Effexor for hot flashes, which improves symptoms though hot flashes still occurs. She reports nausea due to anastrozole and takes Zofran to lessen nausea. She takes Vitamin D once a week.   PAST MEDICAL HISTORY:   Past Medical History: Past Medical History:  Diagnosis Date   Breast cancer, left (HCC) 01/05/2016   breast   Diabetes mellitus without complication (HCC)    Hyperlipidemia    Hypertension 2015    Surgical History: Past Surgical History:  Procedure Laterality Date   BREAST BIOPSY Right 1998   BREAST LUMPECTOMY Left 2015   Left lumpectomy and sentinel lymph node biopsy and modified axillary dissection by Dr. Gabriel Cirri   CATARACT EXTRACTION  W/PHACO Left 06/22/2021   Procedure: CATARACT EXTRACTION PHACO AND INTRAOCULAR LENS PLACEMENT (IOC);  Surgeon: Fabio Pierce, MD;  Location: [facility];  Service: Ophthalmology;  Laterality: Left;  CDE: 10.61   CATARACT EXTRACTION W/PHACO Right 07/17/2021   Procedure: CATARACT EXTRACTION PHACO AND INTRAOCULAR LENS PLACEMENT (IOC);  Surgeon: Fabio Pierce, MD;  Location: [facility];  Service: Ophthalmology;  Laterality: Right;  CDE: 5.77   PORT-A-CATH REMOVAL     TUBAL LIGATION      Social History: Social History   Socioeconomic History   Marital status: Divorced    Spouse name: Not on file   Number of children: 2   Years of education: ged   Highest education level: Not on file  Occupational History   Occupation: dietician aid    Comment: [employer]  Tobacco Use   Smoking status: Never   Smokeless tobacco: Never  Vaping Use   Vaping status: Never Used  Substance and Sexual Activity   Alcohol use: No   Drug use: No   Sexual activity:  Yes    Birth control/protection: Post-menopausal  Other Topics Concern   Not on file  Social History Narrative   Lives alone   Two daughters are grown   Sometimes stays with sister Clotilde Dieter   Social Drivers of Health   Financial Resource Strain: Not on file  Food Insecurity: Not on file  Transportation Needs: Not on file  Physical Activity: Not on file  Stress: Not on file  Social Connections: Not on file  Intimate Partner Violence: Not on file    Family History: Family History  Problem Relation Age of Onset   Cancer Mother        breast   COPD Father    Emphysema Father    Hypertension Sister    Hyperlipidemia Sister    Cancer Brother        lung   Pulmonary embolism Maternal Aunt    Diabetes Paternal Aunt    Stroke Maternal Grandmother    Stroke Maternal Grandfather    Aneurysm Paternal Grandmother    Stroke Paternal Grandfather    Early death Brother 7       murder   Hypertension Daughter    Edema Daughter    Colon  cancer Neg Hx     Current Medications:  Current Outpatient Medications:    anastrozole (ARIMIDEX) 1 MG tablet, Take 1 tablet (1 mg total) by mouth daily., Disp: 90 tablet, Rfl: 4   atorvastatin (LIPITOR) 20 MG tablet, Take 1 tablet (20 mg total) by mouth daily., Disp: 90 tablet, Rfl: 3   calcium-vitamin D (OSCAL WITH D) 500-200 MG-UNIT tablet, Take 2 tablets by mouth daily with breakfast., Disp: 60 tablet, Rfl: 12   Cholecalciferol (D3 DOTS) 50 MCG (2000 UT) TBDP, Take 1 capsule by mouth daily., Disp: 30 tablet, Rfl: 12   citalopram (CELEXA) 10 MG tablet, Take 10 mg by mouth daily., Disp: , Rfl:    ergocalciferol (VITAMIN D2) 1.25 MG (50000 UT) capsule, Take 1 capsule (50,000 Units total) by mouth once a week., Disp: 12 capsule, Rfl: 0   glimepiride (AMARYL) 1 MG tablet, TAKE 1/2 TABLET BY MOUTH ONCE DAILY, Disp: 45 tablet, Rfl: 3   hydrochlorothiazide (MICROZIDE) 12.5 MG capsule, Take 1 capsule (12.5 mg total) daily by mouth., Disp: 90 capsule, Rfl: 3   lovastatin (MEVACOR) 40 MG tablet, Take 40 mg by mouth daily., Disp: , Rfl:    metFORMIN (GLUCOPHAGE) 500 MG tablet, Take 500 mg by mouth 2 (two) times daily., Disp: , Rfl:    nystatin (MYCOSTATIN/NYSTOP) powder, Apply powder under breasts twice daily, Disp: 30 g, Rfl: 6   OS-CAL CALCIUM + D3 500-200 MG-UNIT TABS, Take 2 tablets by mouth every morning., Disp: , Rfl:    pantoprazole (PROTONIX) 40 MG tablet, Take 40 mg by mouth daily., Disp: , Rfl:    potassium chloride SA (K-DUR,KLOR-CON) 20 MEQ tablet, Take 1 tablet (20 mEq total) by mouth 2 (two) times daily. (Patient taking differently: Take 20 mEq by mouth daily.), Disp: 180 tablet, Rfl: 1   venlafaxine XR (EFFEXOR-XR) 75 MG 24 hr capsule, TAKE 1 CAPSULE BY MOUTH EVERY DAY, Disp: 90 capsule, Rfl: 4   ondansetron (ZOFRAN) 4 MG tablet, Take 1 tablet (4 mg total) by mouth every 8 (eight) hours as needed for nausea or vomiting., Disp: 30 tablet, Rfl: 11   zolpidem (AMBIEN) 10 MG tablet, Take  0.5-1 tablets (5-10 mg total) by mouth at bedtime as needed for sleep. (Patient not taking: Reported on 09/03/2019), Disp: 30 tablet, Rfl:  1 No current facility-administered medications for this visit.  Facility-Administered Medications Ordered in Other Visits:    neomycin-polymyxin b-dexamethasone (MAXITROL) ophthalmic suspension, , , PRN, Fabio Pierce, MD, 1 drop at 06/22/21 1435   Allergies: Allergies  Allergen Reactions   Effexor [Venlafaxine] Nausea And Vomiting    GI intolerance only   Hydrocodone Nausea And Vomiting    GI intolerance only    REVIEW OF SYSTEMS:   Review of Systems  Constitutional:  Negative for chills, fatigue and fever.  HENT:   Negative for lump/mass, mouth sores, nosebleeds, sore throat and trouble swallowing.   Eyes:  Negative for eye problems.  Respiratory:  Negative for cough and shortness of breath.   Cardiovascular:  Negative for chest pain, leg swelling and palpitations.  Gastrointestinal:  Positive for diarrhea. Negative for abdominal pain, constipation, nausea and vomiting.  Endocrine: Positive for hot flashes.  Genitourinary:  Negative for bladder incontinence, difficulty urinating, dysuria, frequency, hematuria and nocturia.   Musculoskeletal:  Negative for arthralgias, back pain, flank pain, myalgias and neck pain.  Skin:  Negative for itching and rash.  Neurological:  Positive for numbness (in fingers). Negative for dizziness and headaches.  Hematological:  Does not bruise/bleed easily.  Psychiatric/Behavioral:  Negative for depression, sleep disturbance and suicidal ideas. The patient is not nervous/anxious.   All other systems reviewed and are negative.    VITALS:   Blood pressure (!) 165/98, pulse 73, temperature 97.6 F (36.4 C), temperature source Oral, resp. rate 16, weight 150 lb 9.2 oz (68.3 kg), SpO2 100%.  Wt Readings from Last 3 Encounters:  04/14/23 150 lb 9.2 oz (68.3 kg)  04/13/22 153 lb 11.2 oz (69.7 kg)  07/09/21 138 lb  14.2 oz (63 kg)    Body mass index is 27.54 kg/m.  Performance status (ECOG): 1 - Symptomatic but completely ambulatory  PHYSICAL EXAM:   Physical Exam Vitals and nursing note reviewed. Exam conducted with a chaperone present.  Constitutional:      Appearance: Normal appearance.  Cardiovascular:     Rate and Rhythm: Normal rate and regular rhythm.     Pulses: Normal pulses.     Heart sounds: Normal heart sounds.  Pulmonary:     Effort: Pulmonary effort is normal.     Breath sounds: Normal breath sounds.  Chest:     Comments: +upper inner quadrant lumpectomy scar is WNL +No masses palpable on breasts bilaterally Abdominal:     Palpations: Abdomen is soft. There is no hepatomegaly, splenomegaly or mass.     Tenderness: There is no abdominal tenderness.  Musculoskeletal:     Right lower leg: No edema.     Left lower leg: No edema.  Lymphadenopathy:     Cervical: No cervical adenopathy.     Right cervical: No superficial, deep or posterior cervical adenopathy.    Left cervical: No superficial, deep or posterior cervical adenopathy.     Upper Body:     Right upper body: No supraclavicular or axillary adenopathy.     Left upper body: No supraclavicular or axillary adenopathy.  Neurological:     General: No focal deficit present.     Mental Status: She is alert and oriented to person, place, and time.  Psychiatric:        Mood and Affect: Mood normal.        Behavior: Behavior normal.   Breast Exam Chaperone: Chapman Moss, RN   LABS:   CBC     Component Value Date/Time   WBC  6.7 04/07/2023 0925   RBC 4.64 04/07/2023 0925   HGB 12.8 04/07/2023 0925   HCT 39.6 04/07/2023 0925   PLT 205 04/07/2023 0925   MCV 85.3 04/07/2023 0925   MCH 27.6 04/07/2023 0925   MCHC 32.3 04/07/2023 0925   RDW 14.1 04/07/2023 0925   LYMPHSABS 2.6 04/07/2023 0925   MONOABS 0.6 04/07/2023 0925   EOSABS 0.2 04/07/2023 0925   BASOSABS 0.1 04/07/2023 0925    CMP      Component  Value Date/Time   NA 140 04/07/2023 0925   K 3.1 (L) 04/07/2023 0925   CL 103 04/07/2023 0925   CO2 29 04/07/2023 0925   GLUCOSE 63 (L) 04/07/2023 0925   BUN 17 04/07/2023 0925   CREATININE 0.80 04/07/2023 0925   CREATININE 0.84 12/10/2016 1500   CALCIUM 9.6 04/07/2023 0925   PROT 7.2 04/07/2023 0925   ALBUMIN 3.9 04/07/2023 0925   AST 18 04/07/2023 0925   ALT 16 04/07/2023 0925   ALKPHOS 64 04/07/2023 0925   BILITOT 0.7 04/07/2023 0925   GFRNONAA >60 04/07/2023 0925   GFRNONAA 76 12/10/2016 1500   GFRAA >60 08/28/2019 1429   GFRAA 88 12/10/2016 1500     No results found for: "CEA1", "CEA" / No results found for: "CEA1", "CEA" No results found for: "PSA1" No results found for: "ZOX096" No results found for: "CAN125"  No results found for: "TOTALPROTELP", "ALBUMINELP", "A1GS", "A2GS", "BETS", "BETA2SER", "GAMS", "MSPIKE", "SPEI" No results found for: "TIBC", "FERRITIN", "IRONPCTSAT" Lab Results  Component Value Date   LDH 107 04/02/2021   LDH 138 09/23/2020   LDH 106 02/22/2020     STUDIES:   DG Bone Density Result Date: 03/31/2023 EXAM: DUAL X-RAY ABSORPTIOMETRY (DXA) FOR BONE MINERAL DENSITY IMPRESSION: Your patient Laura Moon completed a BMD test on 03/31/2023 using the [employer] DXA System (software version: 14.10) manufactured by [employer]. The following summarizes the results of our evaluation. Technologist: [facility] PATIENT BIOGRAPHICAL: Name: Laura Moon, Laura Moon Patient ID: [id number] Birth Date: 04-Nov-1955 Height: 62.0 in. Gender: Female Exam Date: 03/31/2023 Weight: 153.7 lbs. Indications: Follow up Osteopenia, Height Loss, Hx Breast Ca, Post Menopausal, Secondary Osteoporosis Fractures: Treatments: Anastrozole, Calcium, Vitamin D DENSITOMETRY RESULTS: Site          Region     Measured Date Measured Age WHO Classification Young Adult T-score BMD         %Change vs. Previous Significant Change (*) DualFemur Neck Right 03/31/2023 66.2 Osteopenia -1.6 0.813  g/cm2 -1.7% - DualFemur Neck Right 03/10/2020 63.1 Osteopenia -1.5 0.827 g/cm2 -3.3% - DualFemur Neck Right 02/24/2018 61.1 Osteopenia -1.3 0.855 g/cm2 -11.2% Yes DualFemur Neck Right 02/02/2016 59.0 Normal -0.5 0.963 g/cm2 - - DualFemur Total Mean 03/31/2023 66.2 Osteopenia -1.3 0.846 g/cm2 -3.9% Yes DualFemur Total Mean 03/10/2020 63.1 Normal -1.0 0.880 g/cm2 -3.1% Yes DualFemur Total Mean 02/24/2018 61.1 - - 0.908 g/cm2 -6.6% Yes DualFemur Total Mean 02/02/2016 59.0 Normal -0.3 0.972 g/cm2 - - Right Forearm Radius 33% 03/31/2023 66.2 Normal -0.4 0.684 g/cm2 -2.1% - Right Forearm Radius 33% 03/10/2020 63.1 Normal -0.2 0.699 g/cm2 -4.5% - Right Forearm Radius 33% 02/24/2018 61.1 Normal 0.3 0.731 g/cm2 - - ASSESSMENT: The BMD measured at Femur Neck Right is 0.813 g/cm2 with a T-score of -1.6. This patient is considered osteopenic according to World Health Organization [facility]) criteria. The scan quality is good. Compared with the prior study on 03/10/20, the BMD of the total mean shows a statistically significant decrease. Lumbar spine was excluded due  to advanced degenerative changes. World Science writer [facility]) criteria for post-menopausal, Caucasian Women: Normal:       T-score at or above -1 SD Osteopenia:   T-score between -1 and -2.5 SD Osteoporosis: T-score at or below -2.5 SD RECOMMENDATIONS: 1. All patients should optimize calcium and vitamin D intake. 2. Consider FDA-approved medical therapies in postmenopausal women and med aged 19 years and older, based on the following: a. A hip or vertebral (clinical or morphometric) fracture b. T-score< -2.5 at the femoral neck or spine after appropriate evaluation to exclude secondary causes c. Low bone mass (T-score between -1.0 and -2.5 at the femoral neck or spine) and a 10-year probability of a hip fracture > 3% or a 10-year probability of a major osteoporosis-related fracture > 20% based on the [place]-adapted WHO algorithm d. Clinician judgment and/or patient  preferences may indicate treatment for people with 10-year fracture probabilities above or below these levels FOLLOW-UP: Patients with diagnosis of osteoporosis or at high risk for fracture should have regular bone mineral density tests. For patients eligible for Medicare, routine testing is allowed once every 2 years. The testing frequency can be increased to one year for patients who have rapidly progressing disease, those who are receiving or discontinuing medical therapy to restore bone mass, or have additional risk factors. I have reviewed this report, and agree with the above findings. [facility] Your patient Laura Moon completed a FRAX assessment on 03/31/2023 using the [employer] DXA System (analysis version: 14.10) manufactured by [employer]. The following summarizes the results of our evaluation. PATIENT BIOGRAPHICAL: Name: Laura Moon, Laura Moon Patient ID: [id number] Birth Date: 02/13/1956 Height:    62.0 in. Gender:     [age]    [age]       Weight:    153.7 lbs. Ethnicity:  Black                            Exam Date: 03/31/2023 FRAX* RESULTS:  (version: 3.5) 10-year Probability of Fracture1 Major Osteoporotic Fracture2 Hip Fracture 4.2% 0.5% Population: [place] (Black) Risk Factors: Secondary Osteoporosis Based on DualFemur (Right) Neck BMD 1 -The 10-year probability of fracture may be lower than reported if the patient has received treatment. 2 -Major Osteoporotic Fracture: Clinical Spine, Forearm, Hip or Shoulder *FRAX is a Armed forces logistics/support/administrative officer of the [employer] for Metabolic Bone Disease, a World Science writer (WHO) [employer]. ASSESSMENT: The probability of a major osteoporotic fracture is 4.2% within the next ten years. The probability of a hip fracture is 0.5% within the next ten years. Electronically Signed   By: Frederico Hamman M.D.   On: 03/31/2023 09:40

## 2023-04-14 ENCOUNTER — Inpatient Hospital Stay: Payer: Medicare Other | Admitting: Hematology

## 2023-04-14 VITALS — BP 165/98 | HR 73 | Temp 97.6°F | Resp 16 | Wt 150.6 lb

## 2023-04-14 DIAGNOSIS — Z170 Estrogen receptor positive status [ER+]: Secondary | ICD-10-CM | POA: Diagnosis not present

## 2023-04-14 DIAGNOSIS — M8580 Other specified disorders of bone density and structure, unspecified site: Secondary | ICD-10-CM | POA: Diagnosis not present

## 2023-04-14 DIAGNOSIS — E559 Vitamin D deficiency, unspecified: Secondary | ICD-10-CM | POA: Diagnosis not present

## 2023-04-14 DIAGNOSIS — Z79811 Long term (current) use of aromatase inhibitors: Secondary | ICD-10-CM | POA: Diagnosis not present

## 2023-04-14 DIAGNOSIS — C50312 Malignant neoplasm of lower-inner quadrant of left female breast: Secondary | ICD-10-CM

## 2023-04-14 DIAGNOSIS — R110 Nausea: Secondary | ICD-10-CM | POA: Diagnosis not present

## 2023-04-14 MED ORDER — ONDANSETRON HCL 4 MG PO TABS: 4.0000 mg | ORAL_TABLET | Freq: Three times a day (TID) | ORAL | 11 refills | Status: AC | PRN

## 2023-04-14 NOTE — Patient Instructions (Addendum)
[facility] at [facility] Discharge Instructions   You were seen and examined today by Dr. Ellin Saba.  He reviewed the results of your lab work which are mostly normal/stable. Your vitamin D was high. Stop the weekly pill and start taking vitamin D 5000 units daily.   He reviewed the results of your bone density which is showing osteopenia.   We will proceed with your treatment today.   Return as scheduled.    Thank you for choosing [facility] at [facility] to provide your oncology and hematology care.  To afford each patient quality time with our provider, please arrive at least 15 minutes before your scheduled appointment time.   If you have a lab appointment with the Cancer Center please come in thru the Main Entrance and check in at the main information desk.  You need to re-schedule your appointment should you arrive 10 or more minutes late.  We strive to give you quality time with our providers, and arriving late affects you and other patients whose appointments are after yours.  Also, if you no show three or more times for appointments you may be dismissed from the clinic at the providers discretion.     Again, thank you for choosing [facility].  Our hope is that these requests will decrease the amount of time that you wait before being seen by our physicians.       _____________________________________________________________  Should you have questions after your visit to [facility], please contact our office at [phone] and follow the prompts.  Our office hours are 8:00 a.m. and 4:30 p.m. Monday - Friday.  Please note that voicemails left after 4:00 p.m. may not be returned until the following business day.  We are closed weekends and major holidays.  You do have access to a nurse 24-7, just call the main number to the clinic [phone] and do not press any options, hold on the line and a nurse will  answer the phone.    For prescription refill requests, have your pharmacy contact our office and allow 72 hours.    Due to Covid, you will need to wear a mask upon entering the hospital. If you do not have a mask, a mask will be given to you at the Main Entrance upon arrival. For doctor visits, patients may have 1 support person age 22 or older with them. For treatment visits, patients can not have anyone with them due to social distancing guidelines and our immunocompromised population.

## 2023-07-14 DIAGNOSIS — Z299 Encounter for prophylactic measures, unspecified: Secondary | ICD-10-CM | POA: Diagnosis not present

## 2023-07-14 DIAGNOSIS — M71339 Other bursal cyst, unspecified wrist: Secondary | ICD-10-CM | POA: Diagnosis not present

## 2023-07-14 DIAGNOSIS — E1165 Type 2 diabetes mellitus with hyperglycemia: Secondary | ICD-10-CM | POA: Diagnosis not present

## 2023-07-14 DIAGNOSIS — I10 Essential (primary) hypertension: Secondary | ICD-10-CM | POA: Diagnosis not present

## 2023-08-02 DIAGNOSIS — Z1389 Encounter for screening for other disorder: Secondary | ICD-10-CM | POA: Diagnosis not present

## 2023-08-02 DIAGNOSIS — Z0000 Encounter for general adult medical examination without abnormal findings: Secondary | ICD-10-CM | POA: Diagnosis not present

## 2023-08-02 DIAGNOSIS — Z299 Encounter for prophylactic measures, unspecified: Secondary | ICD-10-CM | POA: Diagnosis not present

## 2023-08-02 DIAGNOSIS — I10 Essential (primary) hypertension: Secondary | ICD-10-CM | POA: Diagnosis not present

## 2023-08-02 DIAGNOSIS — Z79899 Other long term (current) drug therapy: Secondary | ICD-10-CM | POA: Diagnosis not present

## 2023-08-02 DIAGNOSIS — Z7189 Other specified counseling: Secondary | ICD-10-CM | POA: Diagnosis not present

## 2023-08-02 DIAGNOSIS — E559 Vitamin D deficiency, unspecified: Secondary | ICD-10-CM | POA: Diagnosis not present

## 2023-08-02 DIAGNOSIS — E7800 Pure hypercholesterolemia, unspecified: Secondary | ICD-10-CM | POA: Diagnosis not present

## 2023-08-16 DIAGNOSIS — R2231 Localized swelling, mass and lump, right upper limb: Secondary | ICD-10-CM | POA: Diagnosis not present

## 2023-08-22 ENCOUNTER — Encounter (INDEPENDENT_AMBULATORY_CARE_PROVIDER_SITE_OTHER): Payer: Self-pay | Admitting: *Deleted

## 2023-08-30 DIAGNOSIS — M67431 Ganglion, right wrist: Secondary | ICD-10-CM | POA: Diagnosis not present

## 2023-08-30 DIAGNOSIS — R2231 Localized swelling, mass and lump, right upper limb: Secondary | ICD-10-CM | POA: Diagnosis not present

## 2023-09-13 DIAGNOSIS — M67431 Ganglion, right wrist: Secondary | ICD-10-CM | POA: Diagnosis not present

## 2023-10-14 ENCOUNTER — Other Ambulatory Visit: Payer: Self-pay | Admitting: Hematology

## 2023-10-27 DIAGNOSIS — E7800 Pure hypercholesterolemia, unspecified: Secondary | ICD-10-CM | POA: Diagnosis not present

## 2023-10-27 DIAGNOSIS — G4700 Insomnia, unspecified: Secondary | ICD-10-CM | POA: Diagnosis not present

## 2023-10-27 DIAGNOSIS — E119 Type 2 diabetes mellitus without complications: Secondary | ICD-10-CM | POA: Diagnosis not present

## 2023-10-27 DIAGNOSIS — I10 Essential (primary) hypertension: Secondary | ICD-10-CM | POA: Diagnosis not present

## 2023-10-27 DIAGNOSIS — Z299 Encounter for prophylactic measures, unspecified: Secondary | ICD-10-CM | POA: Diagnosis not present

## 2023-11-01 DIAGNOSIS — M67431 Ganglion, right wrist: Secondary | ICD-10-CM | POA: Diagnosis not present

## 2023-11-01 DIAGNOSIS — M65921 Unspecified synovitis and tenosynovitis, right upper arm: Secondary | ICD-10-CM | POA: Diagnosis not present

## 2023-11-01 DIAGNOSIS — M65841 Other synovitis and tenosynovitis, right hand: Secondary | ICD-10-CM | POA: Diagnosis not present

## 2023-11-01 DIAGNOSIS — I10 Essential (primary) hypertension: Secondary | ICD-10-CM | POA: Diagnosis not present

## 2023-12-19 ENCOUNTER — Ambulatory Visit (HOSPITAL_COMMUNITY): Payer: Medicare Other

## 2023-12-26 ENCOUNTER — Ambulatory Visit (HOSPITAL_COMMUNITY)
Admission: RE | Admit: 2023-12-26 | Discharge: 2023-12-26 | Disposition: A | Discharge status: Home / Self Care | Payer: Medicare Other | Source: Ambulatory Visit | Attending: Hematology | Admitting: Hematology

## 2023-12-26 DIAGNOSIS — Z1231 Encounter for screening mammogram for malignant neoplasm of breast: Secondary | ICD-10-CM | POA: Diagnosis not present

## 2023-12-26 DIAGNOSIS — C50312 Malignant neoplasm of lower-inner quadrant of left female breast: Secondary | ICD-10-CM

## 2023-12-26 DIAGNOSIS — Z853 Personal history of malignant neoplasm of breast: Secondary | ICD-10-CM | POA: Diagnosis not present

## 2023-12-28 DIAGNOSIS — M67431 Ganglion, right wrist: Secondary | ICD-10-CM | POA: Diagnosis not present

## 2024-02-06 ENCOUNTER — Encounter (INDEPENDENT_AMBULATORY_CARE_PROVIDER_SITE_OTHER): Payer: Self-pay | Admitting: *Deleted

## 2024-04-02 ENCOUNTER — Ambulatory Visit: Admitting: Internal Medicine

## 2024-04-02 ENCOUNTER — Encounter: Payer: Self-pay | Admitting: Internal Medicine

## 2024-04-02 VITALS — BP 164/95 | HR 78 | Temp 97.1°F | Ht 62.0 in | Wt 146.7 lb

## 2024-04-02 DIAGNOSIS — R197 Diarrhea, unspecified: Secondary | ICD-10-CM | POA: Diagnosis not present

## 2024-04-02 DIAGNOSIS — Z1211 Encounter for screening for malignant neoplasm of colon: Secondary | ICD-10-CM

## 2024-04-02 DIAGNOSIS — K219 Gastro-esophageal reflux disease without esophagitis: Secondary | ICD-10-CM

## 2024-04-02 NOTE — Progress Notes (Signed)
 "   Primary Care Physician:  Rosan Jacquline NOVAK, NP Primary Gastroenterologist:  Dr. Cindie  Chief Complaint  Patient presents with   New Patient (Initial Visit)    Patient here today as a new patient in need of a colonoscopy. Patient reports she has bouts of diarrhea, but thinks related to metformin. Patient denies any other current gi related issue.     HPI:   Laura Moon is a 69 y.o. female who presents to clinic today by referral from her PCP Jacquline Rosan  Colon cancer screening: No prior colonoscopy.  No prior testing of any kind including Cologuard.  No family history colorectal malignancy.  No melena hematochezia.  No abdominal pain or unintentional weight loss.  Chronic GERD: Well-controlled on pantoprazole daily.  Denies any dysphagia odynophagia.  No epigastric or chest pain.  Diarrhea: Mild, intermittent, medication induced from her metformin.  Does report formed stools at times as well.   Past Medical History:  Diagnosis Date   Breast cancer, left (HCC) 01/05/2016   breast   Diabetes mellitus without complication (HCC)    Hyperlipidemia    Hypertension 2015    Past Surgical History:  Procedure Laterality Date   BREAST BIOPSY Right 1998   BREAST LUMPECTOMY Left 2015   Left lumpectomy and sentinel lymph node biopsy and modified axillary dissection by Dr. Ivery   CATARACT EXTRACTION W/PHACO Left 06/22/2021   Procedure: CATARACT EXTRACTION PHACO AND INTRAOCULAR LENS PLACEMENT (IOC);  Surgeon: Harrie Agent, MD;  Location: [facility];  Service: Ophthalmology;  Laterality: Left;  CDE: 10.61   CATARACT EXTRACTION W/PHACO Right 07/17/2021   Procedure: CATARACT EXTRACTION PHACO AND INTRAOCULAR LENS PLACEMENT (IOC);  Surgeon: Harrie Agent, MD;  Location: [facility];  Service: Ophthalmology;  Laterality: Right;  CDE: 5.77   PORT-A-CATH REMOVAL     TUBAL LIGATION     WRIST SURGERY Right 2025    Current Outpatient Medications  Medication Sig Dispense Refill    anastrozole  (ARIMIDEX ) 1 MG tablet Take 1 tablet (1 mg total) by mouth daily. 90 tablet 4   atorvastatin  (LIPITOR) 20 MG tablet Take 1 tablet (20 mg total) by mouth daily. 90 tablet 3   calcium -vitamin D  (OSCAL WITH D) 500-200 MG-UNIT tablet Take 2 tablets by mouth daily with breakfast. (Patient taking differently: Take 1 tablet by mouth daily with breakfast.) 60 tablet 12   Cholecalciferol  (D3 DOTS) 50 MCG (2000 UT) TBDP Take 1 capsule by mouth daily. 30 tablet 12   citalopram  (CELEXA ) 10 MG tablet Take 10 mg by mouth daily.     glimepiride  (AMARYL ) 1 MG tablet TAKE 1/2 TABLET BY MOUTH ONCE DAILY 45 tablet 3   hydrochlorothiazide  (MICROZIDE ) 12.5 MG capsule Take 1 capsule (12.5 mg total) daily by mouth. 90 capsule 3   lovastatin  (MEVACOR ) 40 MG tablet Take 40 mg by mouth daily.     metFORMIN (GLUCOPHAGE) 500 MG tablet Take 500 mg by mouth 2 (two) times daily.     nystatin  (MYCOSTATIN /NYSTOP ) powder Apply powder under breasts twice daily 30 g 6   ondansetron  (ZOFRAN ) 4 MG tablet Take 1 tablet (4 mg total) by mouth every 8 (eight) hours as needed for nausea or vomiting. 30 tablet 11   OS-CAL CALCIUM  + D3 500-200 MG-UNIT TABS Take 2 tablets by mouth every morning.     pantoprazole (PROTONIX) 40 MG tablet Take 40 mg by mouth daily.     potassium chloride  SA (K-DUR,KLOR-CON ) 20 MEQ tablet Take 1 tablet (20 mEq total) by mouth  2 (two) times daily. (Patient taking differently: Take 20 mEq by mouth daily.) 180 tablet 1   venlafaxine  XR (EFFEXOR -XR) 75 MG 24 hr capsule TAKE 1 CAPSULE BY MOUTH EVERY DAY 90 capsule 4   zolpidem  (AMBIEN ) 10 MG tablet Take 0.5-1 tablets (5-10 mg total) by mouth at bedtime as needed for sleep. 30 tablet 1   No current facility-administered medications for this visit.   Facility-Administered Medications Ordered in Other Visits  Medication Dose Route Frequency Provider Last Rate Last Admin   neomycin -polymyxin b-dexamethasone  (MAXITROL ) ophthalmic suspension    PRN Harrie Agent, MD   1 drop at 06/22/21 1435    Allergies as of 04/02/2024 - Review Complete 04/02/2024  Allergen Reaction Noted   Effexor  [venlafaxine ] Nausea And Vomiting 01/05/2016   Hydrocodone Nausea And Vomiting 06/28/2016    Family History  Problem Relation Age of Onset   Cancer Mother        breast   COPD Father    Emphysema Father    Hypertension Sister    Hyperlipidemia Sister    Cancer Brother        lung   Pulmonary embolism Maternal Aunt    Diabetes Paternal Aunt    Stroke Maternal Grandmother    Stroke Maternal Grandfather    Aneurysm Paternal Grandmother    Stroke Paternal Grandfather    Early death Brother 14       murder   Hypertension Daughter    Edema Daughter    Colon cancer Neg Hx     Social History   Socioeconomic History   Marital status: Divorced    Spouse name: Not on file   Number of children: 2   Years of education: ged   Highest education level: Not on file  Occupational History   Occupation: dietician aid    Comment: [employer]  Tobacco Use   Smoking status: Never   Smokeless tobacco: Never  Vaping Use   Vaping status: Never Used  Substance and Sexual Activity   Alcohol use: No   Drug use: No   Sexual activity: Yes    Birth control/protection: Post-menopausal  Other Topics Concern   Not on file  Social History Narrative   Lives alone   Two daughters are grown   Sometimes stays with sister [place]   Social Drivers of Health   Tobacco Use: Low Risk (04/02/2024)   Patient History    Smoking Tobacco Use: Never    Smokeless Tobacco Use: Never    Passive Exposure: Not on file  Financial Resource Strain: Not on file  Food Insecurity: Low Risk (11/21/2023)   Received from [facility]   Epic    Within the past 12 months, you worried that your food would run out before you got money to buy more: Never true    Within the past 12 months, the food you bought just didn't last and you didn't have money to get more. : Never true   Transportation Needs: No Transportation Needs (11/21/2023)   Received from [facility]    In the past 12 months, has lack of reliable transportation kept you from medical appointments, meetings, work or from getting things needed for daily living? : No  Physical Activity: Not on file  Stress: Not on file  Social Connections: Not on file  Intimate Partner Violence: Not on file  Depression (EYV7-0): Not on file  Alcohol Screen: Not on file  Housing: Low Risk (11/21/2023)   Received from  [facility]   Epic    What is your living situation today?: I have a steady place to live    Think about the place you live. Do you have problems with any of the following? Choose all that apply:: None/None on this list  Utilities: Low Risk (11/21/2023)   Received from [facility]   Utilities    In the past 12 months has the electric, gas, oil, or water  company threatened to shut off services in your home? : No  Health Literacy: Not on file    Subjective: Review of Systems  Constitutional:  Negative for chills and fever.  HENT:  Negative for congestion and hearing loss.   Eyes:  Negative for blurred vision and double vision.  Respiratory:  Negative for cough and shortness of breath.   Cardiovascular:  Negative for chest pain and palpitations.  Gastrointestinal:  Negative for abdominal pain, blood in stool, constipation, diarrhea, heartburn, melena and vomiting.  Genitourinary:  Negative for dysuria and urgency.  Musculoskeletal:  Negative for joint pain and myalgias.  Skin:  Negative for itching and rash.  Neurological:  Negative for dizziness and headaches.  Psychiatric/Behavioral:  Negative for depression. The patient is not nervous/anxious.        Objective: BP (!) 164/95 (BP Location: Right Arm, Patient Position: Sitting, Cuff Size: Normal)   Pulse 78   Temp (!) 97.1 F (36.2 C) (Temporal)   Ht 5' 2 (1.575 m)   Wt 146 lb 11.2 oz (66.5 kg)   BMI 26.83 kg/m  Physical  Exam Constitutional:      Appearance: Normal appearance.  HENT:     Head: Normocephalic and atraumatic.  Eyes:     Extraocular Movements: Extraocular movements intact.     Conjunctiva/sclera: Conjunctivae normal.  Cardiovascular:     Rate and Rhythm: Normal rate and regular rhythm.  Pulmonary:     Effort: Pulmonary effort is normal.     Breath sounds: Normal breath sounds.  Abdominal:     General: Bowel sounds are normal.     Palpations: Abdomen is soft.  Musculoskeletal:        General: No swelling. Normal range of motion.     Cervical back: Normal range of motion and neck supple.  Skin:    General: Skin is warm and dry.     Coloration: Skin is not jaundiced.  Neurological:     General: No focal deficit present.     Mental Status: She is alert and oriented to person, place, and time.  Psychiatric:        Mood and Affect: Mood normal.        Behavior: Behavior normal.      Assessment/Plan:  1.  Colon cancer screening- Will schedule for screening colonoscopy.The risks including infection, bleed, or perforation as well as benefits, limitations, alternatives and imponderables have been reviewed with the patient. Questions have been answered. All parties agreeable.  2.  Chronic GERD-well-controlled on pantoprazole daily.  Will continue.  3.  Diarrhea-mild, intermittent.  Likely medication induced from metformin.  Imodium as needed.  Thank you Jacquline [facility] for the kind referral.   04/02/2024 9:36 AM    "

## 2024-04-02 NOTE — Patient Instructions (Signed)
 We will schedule you for colonoscopy for colon cancer screening purposes.  Continue on pantoprazole for your chronic acid reflux.  It was very nice meeting you today.  Dr. Cindie

## 2024-04-10 ENCOUNTER — Other Ambulatory Visit: Payer: Self-pay

## 2024-04-10 DIAGNOSIS — C50312 Malignant neoplasm of lower-inner quadrant of left female breast: Secondary | ICD-10-CM

## 2024-04-11 ENCOUNTER — Inpatient Hospital Stay: Payer: Medicare Other | Attending: Internal Medicine

## 2024-04-18 ENCOUNTER — Inpatient Hospital Stay: Payer: Medicare Other | Admitting: Physician Assistant

## 2024-04-23 ENCOUNTER — Inpatient Hospital Stay

## 2024-04-30 ENCOUNTER — Inpatient Hospital Stay: Admitting: Physician Assistant
